# Patient Record
Sex: Female | Born: 1995 | Race: White | Hispanic: No | Marital: Married | Smoking: Never smoker
Health system: Southern US, Community
[De-identification: ages and names within clinical notes are randomized; demographics above are authoritative.]

## PROBLEM LIST (undated history)

## (undated) DIAGNOSIS — Z531 Procedure and treatment not carried out because of patient's decision for reasons of belief and group pressure: Secondary | ICD-10-CM

## (undated) DIAGNOSIS — J45909 Unspecified asthma, uncomplicated: Secondary | ICD-10-CM

## (undated) DIAGNOSIS — F329 Major depressive disorder, single episode, unspecified: Secondary | ICD-10-CM

## (undated) DIAGNOSIS — F32A Depression, unspecified: Secondary | ICD-10-CM

## (undated) DIAGNOSIS — IMO0001 Reserved for inherently not codable concepts without codable children: Secondary | ICD-10-CM

## (undated) HISTORY — PX: FRACTURE SURGERY: SHX138

---

## 2014-08-26 ENCOUNTER — Encounter (HOSPITAL_COMMUNITY): Payer: Self-pay

## 2014-08-26 ENCOUNTER — Emergency Department (HOSPITAL_COMMUNITY)
Admission: EM | Admit: 2014-08-26 | Discharge: 2014-08-26 | Disposition: A | Discharge status: Home / Self Care | Payer: Self-pay | Attending: Emergency Medicine | Admitting: Emergency Medicine

## 2014-08-26 DIAGNOSIS — R531 Weakness: Secondary | ICD-10-CM | POA: Insufficient documentation

## 2014-08-26 DIAGNOSIS — R0602 Shortness of breath: Secondary | ICD-10-CM

## 2014-08-26 DIAGNOSIS — R079 Chest pain, unspecified: Secondary | ICD-10-CM | POA: Insufficient documentation

## 2014-08-26 DIAGNOSIS — R29898 Other symptoms and signs involving the musculoskeletal system: Secondary | ICD-10-CM

## 2014-08-26 DIAGNOSIS — J45901 Unspecified asthma with (acute) exacerbation: Secondary | ICD-10-CM | POA: Insufficient documentation

## 2014-08-26 DIAGNOSIS — R200 Anesthesia of skin: Secondary | ICD-10-CM | POA: Insufficient documentation

## 2014-08-26 HISTORY — DX: Unspecified asthma, uncomplicated: J45.909

## 2014-08-26 LAB — CBG MONITORING, ED: Glucose-Capillary: 91 mg/dL (ref 70–99)

## 2014-08-26 NOTE — ED Notes (Signed)
Checked patient blood sugar it was 91 notified RN of blood sugar 

## 2014-08-26 NOTE — ED Provider Notes (Signed)
CSN: [id number]     Arrival date & time 08/26/14  1333 History   None    No chief complaint on file.    (Consider location/radiation/quality/duration/timing/severity/associated sxs/prior Treatment) Patient is a 19 y.o. female presenting with Acute Neurological Problem. The history is provided by the patient. No language interpreter was used.  Cerebrovascular Accident This is a new problem. The current episode started today. The problem has been resolved. Associated symptoms include weakness. Pertinent negatives include no abdominal pain, arthralgias, chest pain, chills, congestion, coughing, diaphoresis, fatigue, fever, headaches, joint swelling, myalgias, nausea, neck pain, numbness, rash, urinary symptoms, vertigo or vomiting. Nothing aggravates the symptoms. She has tried nothing for the symptoms.    Past Medical History  Diagnosis Date  . Asthma    History reviewed. No pertinent past surgical history. History reviewed. No pertinent family history. History  Substance Use Topics  . Smoking status: Never Smoker   . Smokeless tobacco: Not on file  . Alcohol Use: No   OB History    No data available     Review of Systems  Constitutional: Negative for fever, chills, diaphoresis and fatigue.  HENT: Negative for congestion.   Respiratory: Positive for chest tightness and shortness of breath. Negative for cough.   Cardiovascular: Negative for chest pain.  Gastrointestinal: Negative for nausea, vomiting and abdominal pain.  Musculoskeletal: Negative for myalgias, back pain, joint swelling, arthralgias, gait problem and neck pain.  Skin: Negative for rash.  Neurological: Positive for weakness. Negative for dizziness, vertigo, seizures, syncope, facial asymmetry, speech difficulty, light-headedness, numbness and headaches.  Psychiatric/Behavioral: Negative for confusion.  All other systems reviewed and are negative.     Allergies  Review of patient's allergies indicates no known  allergies.  Home Medications   Prior to Admission medications   Not on File   BP 111/68 mmHg  Pulse 63  Temp(Src) 97.9 F (36.6 C) (Oral)  Resp 15  Ht 4\' 10"  (1.473 m)  Wt 158 lb 9.6 oz (71.94 kg)  BMI 33.16 kg/m2  SpO2 99%  LMP 08/26/2014 (Exact Date) Physical Exam  Constitutional: She is oriented to person, place, and time. Vital signs are normal. She appears well-developed and well-nourished. She is cooperative. She does not appear ill. No distress.  HENT:  Head: Normocephalic and atraumatic.  Nose: Nose normal.  Mouth/Throat: Oropharynx is clear and moist. No oropharyngeal exudate.  Eyes: EOM are normal. Pupils are equal, round, and reactive to light.  Neck: Normal range of motion. Neck supple.  Cardiovascular: Normal rate, regular rhythm, normal heart sounds and intact distal pulses.   No murmur heard. Pulmonary/Chest: Effort normal and breath sounds normal. No respiratory distress. She has no wheezes. She exhibits no tenderness.  Normal work of breathing, clear lungs, nontender  Abdominal: Soft. There is no tenderness. There is no rebound and no guarding.  Musculoskeletal: Normal range of motion. She exhibits no tenderness.  Lymphadenopathy:    She has no cervical adenopathy.  Neurological: She is alert and oriented to person, place, and time. No cranial nerve deficit. Coordination normal.  Full strength and sensation.  Normal coordination.  Steady gait.  No facial droop, clear speech.   Skin: Skin is warm and dry. She is not diaphoretic.  Psychiatric: She has a normal mood and affect. Her behavior is normal. Judgment and thought content normal.  Nursing note and vitals reviewed.   ED Course  Procedures (including critical care time) Labs Review Labs Reviewed  CBG MONITORING, ED    Imaging Review  No results found.   EKG Interpretation   Date/Time:  Friday August 26 2014 14:10:12 EDT Ventricular Rate:  75 PR Interval:  139 QRS Duration: 82 QT Interval:   384 QTC Calculation: 429 R Axis:   56 Text Interpretation:  Sinus arrhythmia Ventricular premature complex  artifact noted Confirmed by Bebe Shaggy  MD, DONALD ([id number]) on 08/26/2014  3:35:49 PM      MDM   Final diagnoses:  Arm heaviness  SOB (shortness of breath)   Pt is a 19 yo F with no PMH who present with bilateral upper extremity numbness and chest pain.  Recently moved to a new house 2 weeks ago, moved all her stuff herself.  Has had intermittent chest pain and SOB on exertion since.  No significant cough, no fevers, myalgias, or abd sx.  No hx of cardiopulmonary disease.  No personal or family hx of blood clots or CAD at young age.   Then today while at work, she developed symmetrical and bilateral gradual onset of arm heaviness.  No other neuro deficits including no confusion, facial droop, difficulty with speech, lower extremity paresthesias/weakness, headache, vision changes, or gait problems.  The arm heaviness resolved spontaneously after < 1 hour. She denies chest pain, lightheadedness, or SOB during the episode.  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   Appears well, vitals normal.  Exam unremarkable.   No cardiac or CVA risk factors.  PERC negative  EKG reassuring and glucose wnl.   Doubt acute emergent pathology as the cause of her sx.  Advised motrin or tylenol PRN for chest pain. Encouraged stretching and exercise.  Given ED return instructions and provided with a work note per request.  Discharged in good condition.   Patient was seen with ED Attending, Dr. Rivka Safer, MD   Lenell Antu, MD 08/27/14 7846  Zadie Rhine, MD 08/28/14 [phone]

## 2014-08-26 NOTE — ED Notes (Addendum)
Pt presents with onset of BUE tingling and numbness while at work today.  Pt reports symptoms are resolving, denies any numbness to face or legs.  Pt reports during episode, she became short of breath.  Grandmother reports pt has been helping move, lifting heavy boxes and is "exhausted".  Pt also reports mid-sternal chest pain that began when she arrived here.

## 2014-08-26 NOTE — ED Notes (Signed)
Pt from work, reports BUE tingling and "heaviness" that started while taking orders at work today. Pt denies any lightheadedness, dizziness or changes in vision. Pt states heaviness is slowly resolving at this time, no neuro deficits noted. Pt also reports [facility]al cp [facility] Denies any pain at this time, denies any previous episodes.

## 2014-08-26 NOTE — ED Provider Notes (Signed)
Patient seen/examined in the Emergency Department [facility] with [facility] Patient reports chest heaviness and Bilateral arm heaviness, resolved at this time Exam : awake/alert, smiling, no distress, no arm drift, no cardiac murmurs, she is well appearing Plan: d/c home    Holly Rhineonald Cinthia Rodden, MD 08/26/14 1544

## 2017-07-06 ENCOUNTER — Other Ambulatory Visit: Payer: Self-pay

## 2017-07-06 ENCOUNTER — Emergency Department (HOSPITAL_COMMUNITY): Payer: Self-pay

## 2017-07-06 ENCOUNTER — Encounter (HOSPITAL_COMMUNITY): Payer: Self-pay

## 2017-07-06 ENCOUNTER — Inpatient Hospital Stay (HOSPITAL_COMMUNITY)
Admission: EM | Admit: 2017-07-06 | Discharge: 2017-07-08 | DRG: 494 | Disposition: A | Discharge status: Home / Self Care | Payer: Self-pay | Attending: Student | Admitting: Student

## 2017-07-06 DIAGNOSIS — Y92410 Unspecified street and highway as the place of occurrence of the external cause: Secondary | ICD-10-CM

## 2017-07-06 DIAGNOSIS — S82832A Other fracture of upper and lower end of left fibula, initial encounter for closed fracture: Secondary | ICD-10-CM | POA: Diagnosis present

## 2017-07-06 DIAGNOSIS — S82202A Unspecified fracture of shaft of left tibia, initial encounter for closed fracture: Secondary | ICD-10-CM

## 2017-07-06 DIAGNOSIS — S82302A Unspecified fracture of lower end of left tibia, initial encounter for closed fracture: Principal | ICD-10-CM | POA: Diagnosis present

## 2017-07-06 DIAGNOSIS — T148XXA Other injury of unspecified body region, initial encounter: Secondary | ICD-10-CM

## 2017-07-06 DIAGNOSIS — S82402A Unspecified fracture of shaft of left fibula, initial encounter for closed fracture: Secondary | ICD-10-CM

## 2017-07-06 DIAGNOSIS — Z419 Encounter for procedure for purposes other than remedying health state, unspecified: Secondary | ICD-10-CM

## 2017-07-06 DIAGNOSIS — Z23 Encounter for immunization: Secondary | ICD-10-CM

## 2017-07-06 DIAGNOSIS — S82462A Displaced segmental fracture of shaft of left fibula, initial encounter for closed fracture: Secondary | ICD-10-CM | POA: Diagnosis present

## 2017-07-06 DIAGNOSIS — J45909 Unspecified asthma, uncomplicated: Secondary | ICD-10-CM | POA: Diagnosis present

## 2017-07-06 HISTORY — DX: Depression, unspecified: F32.A

## 2017-07-06 HISTORY — DX: Procedure and treatment not carried out because of patient's decision for reasons of belief and group pressure: Z53.1

## 2017-07-06 HISTORY — DX: Reserved for inherently not codable concepts without codable children: IMO0001

## 2017-07-06 HISTORY — DX: Major depressive disorder, single episode, unspecified: F32.9

## 2017-07-06 LAB — CBC WITH DIFFERENTIAL/PLATELET
Basophils Absolute: 0 10*3/uL (ref 0.0–0.1)
Basophils Relative: 0 %
Eosinophils Absolute: 0 10*3/uL (ref 0.0–0.7)
Eosinophils Relative: 0 %
HCT: 37.7 % (ref 36.0–46.0)
Hemoglobin: 12.2 g/dL (ref 12.0–15.0)
Lymphocytes Relative: 14 %
Lymphs Abs: 1.8 10*3/uL (ref 0.7–4.0)
MCH: 28.5 pg (ref 26.0–34.0)
MCHC: 32.4 g/dL (ref 30.0–36.0)
MCV: 88.1 fL (ref 78.0–100.0)
Monocytes Absolute: 0.4 10*3/uL (ref 0.1–1.0)
Monocytes Relative: 3 %
Neutro Abs: 10.1 10*3/uL — ABNORMAL HIGH (ref 1.7–7.7)
Neutrophils Relative %: 83 %
Platelets: 182 10*3/uL (ref 150–400)
RBC: 4.28 MIL/uL (ref 3.87–5.11)
RDW: 13.4 % (ref 11.5–15.5)
WBC: 12.3 10*3/uL — ABNORMAL HIGH (ref 4.0–10.5)

## 2017-07-06 LAB — BASIC METABOLIC PANEL
Anion gap: 10 (ref 5–15)
BUN: 10 mg/dL (ref 6–20)
CO2: 21 mmol/L — ABNORMAL LOW (ref 22–32)
Calcium: 9.2 mg/dL (ref 8.9–10.3)
Chloride: 106 mmol/L (ref 101–111)
Creatinine, Ser: 0.73 mg/dL (ref 0.44–1.00)
GFR calc Af Amer: >60 mL/min (ref >60)
GFR calc non Af Amer: >60 mL/min (ref >60)
Glucose, Bld: 109 mg/dL — ABNORMAL HIGH (ref 65–99)
Potassium: 4 mmol/L (ref 3.5–5.1)
Sodium: 137 mmol/L (ref 135–145)

## 2017-07-06 MED ORDER — OXYCODONE HCL 5 MG PO TABS
5.0000 mg | ORAL_TABLET | ORAL | Status: DC | PRN
  Administered 2017-07-07 – 2017-07-08 (×3): 10 mg via ORAL
  Filled 2017-07-06 (×4): qty 2

## 2017-07-06 MED ORDER — METHOCARBAMOL 1000 MG/10ML IJ SOLN: 500.0000 mg | Freq: Four times a day (QID) | INTRAVENOUS | Status: DC | PRN

## 2017-07-06 MED ORDER — SENNA 8.6 MG PO TABS
1.0000 | ORAL_TABLET | Freq: Two times a day (BID) | ORAL | Status: DC
  Administered 2017-07-07 – 2017-07-08 (×3): 8.6 mg via ORAL
  Filled 2017-07-06 (×3): qty 1

## 2017-07-06 MED ORDER — MORPHINE SULFATE (PF) 4 MG/ML IV SOLN
4.0000 mg | Freq: Once | INTRAVENOUS | Status: AC
  Administered 2017-07-06: 4 mg via INTRAVENOUS
  Filled 2017-07-06: qty 1

## 2017-07-06 MED ORDER — ACETAMINOPHEN 650 MG RE SUPP: 650.0000 mg | Freq: Four times a day (QID) | RECTAL | Status: DC | PRN

## 2017-07-06 MED ORDER — MORPHINE SULFATE (PF) 2 MG/ML IV SOLN
2.0000 mg | INTRAVENOUS | Status: DC | PRN
  Administered 2017-07-07 (×2): 2 mg via INTRAVENOUS
  Filled 2017-07-06 (×2): qty 1

## 2017-07-06 MED ORDER — ACETAMINOPHEN 325 MG PO TABS: 650.0000 mg | ORAL_TABLET | Freq: Four times a day (QID) | ORAL | Status: DC | PRN

## 2017-07-06 MED ORDER — METHOCARBAMOL 500 MG PO TABS
500.0000 mg | ORAL_TABLET | Freq: Four times a day (QID) | ORAL | Status: DC | PRN
  Administered 2017-07-07 – 2017-07-08 (×2): 500 mg via ORAL
  Filled 2017-07-06 (×2): qty 1

## 2017-07-06 MED ORDER — ONDANSETRON HCL 4 MG/2ML IJ SOLN: 4.0000 mg | Freq: Four times a day (QID) | INTRAMUSCULAR | Status: DC | PRN

## 2017-07-06 MED ORDER — ONDANSETRON HCL 4 MG PO TABS: 4.0000 mg | ORAL_TABLET | Freq: Four times a day (QID) | ORAL | Status: DC | PRN

## 2017-07-06 NOTE — Progress Notes (Signed)
Orthopedic Tech Progress Note Patient Details:  Holly JacobMolly [name] 1995-09-21 [id number]  Ortho Devices Type of Ortho Device: Post (short leg) splint Ortho Device/Splint Location: lle Ortho Device/Splint Interventions: Ordered, Application, Adjustment   Post Interventions Patient Tolerated: Well Instructions Provided: Care of device, Adjustment of device   [name], [name] 07/06/2017, 10:15 PM

## 2017-07-06 NOTE — ED Notes (Signed)
Nurse currently drawing labs from IV.
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
## 2017-07-07 ENCOUNTER — Encounter (HOSPITAL_COMMUNITY)
Admission: EM | Disposition: A | Discharge status: Home / Self Care | Payer: Self-pay | Source: Home / Self Care | Attending: Orthopedic Surgery

## 2017-07-07 ENCOUNTER — Inpatient Hospital Stay (HOSPITAL_COMMUNITY): Payer: Self-pay | Admitting: Certified Registered"

## 2017-07-07 ENCOUNTER — Inpatient Hospital Stay (HOSPITAL_COMMUNITY): Payer: Self-pay

## 2017-07-07 ENCOUNTER — Encounter (HOSPITAL_COMMUNITY): Payer: Self-pay | Admitting: Orthopedic Surgery

## 2017-07-07 HISTORY — PX: ORIF TIBIA FRACTURE: SHX5416

## 2017-07-07 HISTORY — PX: ORIF TIBIA & FIBULA FRACTURES: SHX2131

## 2017-07-07 LAB — MRSA PCR SCREENING: MRSA BY PCR: NEGATIVE

## 2017-07-07 LAB — HIV ANTIBODY (ROUTINE TESTING W REFLEX): HIV Screen 4th Generation wRfx: NONREACTIVE

## 2017-07-07 LAB — PREGNANCY, URINE: Preg Test, Ur: NEGATIVE

## 2017-07-07 SURGERY — OPEN REDUCTION INTERNAL FIXATION (ORIF) TIBIA FRACTURE
Anesthesia: General | Site: Ankle | Laterality: Left

## 2017-07-07 MED ORDER — CEFAZOLIN SODIUM-DEXTROSE 2-4 GM/100ML-% IV SOLN
2.0000 g | INTRAVENOUS | Status: AC
  Administered 2017-07-07: 2 g via INTRAVENOUS
  Filled 2017-07-07 (×2): qty 100

## 2017-07-07 MED ORDER — PROPOFOL 10 MG/ML IV BOLUS
INTRAVENOUS | Status: AC
  Filled 2017-07-07: qty 20

## 2017-07-07 MED ORDER — LIDOCAINE HCL (CARDIAC) 20 MG/ML IV SOLN
INTRAVENOUS | Status: DC | PRN
  Administered 2017-07-07: 70 mg via INTRAVENOUS

## 2017-07-07 MED ORDER — OXYCODONE HCL 5 MG PO TABS: 5.0000 mg | ORAL_TABLET | Freq: Once | ORAL | Status: DC | PRN

## 2017-07-07 MED ORDER — PROPOFOL 10 MG/ML IV BOLUS
INTRAVENOUS | Status: DC | PRN
  Administered 2017-07-07: 160 mg via INTRAVENOUS

## 2017-07-07 MED ORDER — ONDANSETRON HCL 4 MG/2ML IJ SOLN
INTRAMUSCULAR | Status: DC | PRN
  Administered 2017-07-07: 4 mg via INTRAVENOUS

## 2017-07-07 MED ORDER — MIDAZOLAM HCL 2 MG/2ML IJ SOLN
INTRAMUSCULAR | Status: AC
  Administered 2017-07-07: 2 mg via INTRAVENOUS
  Filled 2017-07-07: qty 2

## 2017-07-07 MED ORDER — FENTANYL CITRATE (PF) 100 MCG/2ML IJ SOLN: 25.0000 ug | INTRAMUSCULAR | Status: DC | PRN

## 2017-07-07 MED ORDER — PNEUMOCOCCAL VAC POLYVALENT 25 MCG/0.5ML IJ INJ
0.5000 mL | INJECTION | INTRAMUSCULAR | Status: AC
  Administered 2017-07-08: 0.5 mL via INTRAMUSCULAR
  Filled 2017-07-07: qty 0.5

## 2017-07-07 MED ORDER — FENTANYL CITRATE (PF) 250 MCG/5ML IJ SOLN
INTRAMUSCULAR | Status: AC
  Filled 2017-07-07: qty 5

## 2017-07-07 MED ORDER — FENTANYL CITRATE (PF) 100 MCG/2ML IJ SOLN: 50.0000 ug | Freq: Once | INTRAMUSCULAR | Status: DC

## 2017-07-07 MED ORDER — 0.9 % SODIUM CHLORIDE (POUR BTL) OPTIME
TOPICAL | Status: DC | PRN
  Administered 2017-07-07: 1000 mL

## 2017-07-07 MED ORDER — MIDAZOLAM HCL 2 MG/2ML IJ SOLN
INTRAMUSCULAR | Status: AC
  Filled 2017-07-07: qty 2

## 2017-07-07 MED ORDER — ONDANSETRON HCL 4 MG/2ML IJ SOLN: 4.0000 mg | Freq: Four times a day (QID) | INTRAMUSCULAR | Status: DC | PRN

## 2017-07-07 MED ORDER — FENTANYL CITRATE (PF) 100 MCG/2ML IJ SOLN
100.0000 ug | Freq: Once | INTRAMUSCULAR | Status: AC
  Administered 2017-07-07: 100 ug via INTRAVENOUS

## 2017-07-07 MED ORDER — OXYCODONE HCL 5 MG/5ML PO SOLN: 5.0000 mg | Freq: Once | ORAL | Status: DC | PRN

## 2017-07-07 MED ORDER — MIDAZOLAM HCL 2 MG/2ML IJ SOLN
2.0000 mg | Freq: Once | INTRAMUSCULAR | Status: AC
  Administered 2017-07-07: 2 mg via INTRAVENOUS

## 2017-07-07 MED ORDER — FENTANYL CITRATE (PF) 100 MCG/2ML IJ SOLN
INTRAMUSCULAR | Status: AC
  Administered 2017-07-07: 100 ug via INTRAVENOUS
  Filled 2017-07-07: qty 2

## 2017-07-07 MED ORDER — FENTANYL CITRATE (PF) 100 MCG/2ML IJ SOLN
INTRAMUSCULAR | Status: DC | PRN
  Administered 2017-07-07 (×3): 25 ug via INTRAVENOUS

## 2017-07-07 MED ORDER — DEXAMETHASONE SODIUM PHOSPHATE 10 MG/ML IJ SOLN
INTRAMUSCULAR | Status: DC | PRN
  Administered 2017-07-07: 10 mg via INTRAVENOUS

## 2017-07-07 MED ORDER — LACTATED RINGERS IV SOLN
INTRAVENOUS | Status: DC
  Administered 2017-07-07: 11:00:00 via INTRAVENOUS

## 2017-07-07 MED ORDER — BACITRACIN ZINC 500 UNIT/GM EX OINT
TOPICAL_OINTMENT | CUTANEOUS | Status: AC
  Filled 2017-07-07: qty 28.35

## 2017-07-07 MED ORDER — CEFAZOLIN SODIUM-DEXTROSE 2-4 GM/100ML-% IV SOLN
2.0000 g | Freq: Three times a day (TID) | INTRAVENOUS | Status: AC
  Administered 2017-07-07 – 2017-07-08 (×3): 2 g via INTRAVENOUS
  Filled 2017-07-07 (×3): qty 100

## 2017-07-07 MED ORDER — VANCOMYCIN HCL 1000 MG IV SOLR
INTRAVENOUS | Status: AC
  Filled 2017-07-07: qty 1000

## 2017-07-07 MED ORDER — VANCOMYCIN HCL 1000 MG IV SOLR
INTRAVENOUS | Status: DC | PRN
  Administered 2017-07-07: 1000 mg

## 2017-07-07 MED ORDER — LACTATED RINGERS IV SOLN
INTRAVENOUS | Status: DC | PRN
  Administered 2017-07-07 (×2): via INTRAVENOUS

## 2017-07-07 MED ORDER — INFLUENZA VAC SPLIT QUAD 0.5 ML IM SUSY
0.5000 mL | PREFILLED_SYRINGE | INTRAMUSCULAR | Status: AC
  Administered 2017-07-08: 0.5 mL via INTRAMUSCULAR
  Filled 2017-07-07: qty 0.5

## 2017-07-07 MED ORDER — BUPIVACAINE-EPINEPHRINE (PF) 0.5% -1:200000 IJ SOLN
INTRAMUSCULAR | Status: DC | PRN
  Administered 2017-07-07: 15 mL via PERINEURAL
  Administered 2017-07-07: 25 mL via PERINEURAL

## 2017-07-07 SURGICAL SUPPLY — 77 items
BANDAGE ACE 4X5 VEL STRL LF (GAUZE/BANDAGES/DRESSINGS) ×2 IMPLANT
BANDAGE ACE 6X5 VEL STRL LF (GAUZE/BANDAGES/DRESSINGS) ×2 IMPLANT
BANDAGE ESMARK 6X9 LF (GAUZE/BANDAGES/DRESSINGS) ×1 IMPLANT
BIT DRILL 2.5X110 QC LCP DISP (BIT) ×2 IMPLANT
BIT DRILL LCP QC 2X140 (BIT) ×2 IMPLANT
BIT DRILL LONG 2.7 (BIT) ×1 IMPLANT
BIT DRILL QC 3.5X110 (BIT) ×2 IMPLANT
BNDG COHESIVE 4X5 TAN STRL (GAUZE/BANDAGES/DRESSINGS) IMPLANT
BNDG ESMARK 6X9 LF (GAUZE/BANDAGES/DRESSINGS) ×2
BNDG GAUZE ELAST 4 BULKY (GAUZE/BANDAGES/DRESSINGS) ×4 IMPLANT
BRUSH SCRUB SURG 4.25 DISP (MISCELLANEOUS) ×2 IMPLANT
CHLORAPREP W/TINT 26ML (MISCELLANEOUS) ×2 IMPLANT
COVER MAYO STAND STRL (DRAPES) IMPLANT
COVER SURGICAL LIGHT HANDLE (MISCELLANEOUS) ×2 IMPLANT
DRAPE C-ARM 42X72 X-RAY (DRAPES) ×2 IMPLANT
DRAPE C-ARMOR (DRAPES) ×2 IMPLANT
DRAPE HALF SHEET 40X57 (DRAPES) ×2 IMPLANT
DRAPE INCISE IOBAN 66X45 STRL (DRAPES) IMPLANT
DRAPE U-SHAPE 47X51 STRL (DRAPES) ×2 IMPLANT
DRILL BIT LONG 2.7 (BIT) ×2
DRSG ADAPTIC 3X8 NADH LF (GAUZE/BANDAGES/DRESSINGS) ×4 IMPLANT
DRSG PAD ABDOMINAL 8X10 ST (GAUZE/BANDAGES/DRESSINGS) ×8 IMPLANT
ELECT REM PT RETURN 9FT ADLT (ELECTROSURGICAL) ×2
ELECTRODE REM PT RTRN 9FT ADLT (ELECTROSURGICAL) ×1 IMPLANT
GAUZE SPONGE 4X4 12PLY STRL (GAUZE/BANDAGES/DRESSINGS) ×4 IMPLANT
GAUZE XEROFORM 5X9 LF (GAUZE/BANDAGES/DRESSINGS) ×2 IMPLANT
GLOVE BIO SURGEON STRL SZ7.5 (GLOVE) ×8 IMPLANT
GLOVE BIOGEL PI IND STRL 7.0 (GLOVE) ×1 IMPLANT
GLOVE BIOGEL PI IND STRL 7.5 (GLOVE) ×1 IMPLANT
GLOVE BIOGEL PI INDICATOR 7.0 (GLOVE) ×1
GLOVE BIOGEL PI INDICATOR 7.5 (GLOVE) ×1
GLOVE PROGUARD SZ 7 1/2 (GLOVE) ×2 IMPLANT
GLOVE SURG SS PI 7.0 STRL IVOR (GLOVE) ×2 IMPLANT
GOWN STRL REUS W/ TWL LRG LVL3 (GOWN DISPOSABLE) ×2 IMPLANT
GOWN STRL REUS W/TWL LRG LVL3 (GOWN DISPOSABLE) ×2
KIT BASIN OR (CUSTOM PROCEDURE TRAY) ×2 IMPLANT
KIT ROOM TURNOVER OR (KITS) ×2 IMPLANT
MANIFOLD NEPTUNE II (INSTRUMENTS) ×2 IMPLANT
NS IRRIG 1000ML POUR BTL (IV SOLUTION) ×2 IMPLANT
PACK TOTAL JOINT (CUSTOM PROCEDURE TRAY) ×2 IMPLANT
PAD ABD 8X10 STRL (GAUZE/BANDAGES/DRESSINGS) ×2 IMPLANT
PAD ARMBOARD 7.5X6 YLW CONV (MISCELLANEOUS) ×2 IMPLANT
PAD CAST 4YDX4 CTTN HI CHSV (CAST SUPPLIES) ×1 IMPLANT
PADDING CAST COTTON 4X4 STRL (CAST SUPPLIES) ×1
PADDING CAST COTTON 6X4 STRL (CAST SUPPLIES) ×2 IMPLANT
PLATE 4H VA LCP MED DISTAL TIB (Screw) ×2 IMPLANT
PLATE LCP RECON 3.5 8H/112 (Plate) ×2 IMPLANT
SCREW CORTEX 3.5 12MM (Screw) ×2 IMPLANT
SCREW CORTEX 3.5 14MM (Screw) ×2 IMPLANT
SCREW CORTEX 3.5 16MM (Screw) ×2 IMPLANT
SCREW CORTEX LOW PRO 3.5X26 (Screw) ×4 IMPLANT
SCREW CORTEX LOW PRO 3.5X28 (Screw) ×2 IMPLANT
SCREW CORTEX LOW PRO 3.5X32 (Screw) ×2 IMPLANT
SCREW LOCK CORT ST 3.5X12 (Screw) ×2 IMPLANT
SCREW LOCK CORT ST 3.5X14 (Screw) ×2 IMPLANT
SCREW LOCK CORT ST 3.5X16 (Screw) ×2 IMPLANT
SCREW LOCKING 2.7X16MM VA (Screw) ×2 IMPLANT
SCREW LOCKING VA 2.7X38 (Screw) ×2 IMPLANT
SCREW LOCKING VA 2.7X40MM (Screw) ×2 IMPLANT
SCREW LOW PROFILE 3.5X36MM (Screw) ×2 IMPLANT
SCREW SELF TAP 12M (Screw) ×2 IMPLANT
SLEEVE SURGEON STRL (DRAPES) ×2 IMPLANT
SPONGE LAP 18X18 X RAY DECT (DISPOSABLE) IMPLANT
STAPLER VISISTAT 35W (STAPLE) ×2 IMPLANT
STOCKINETTE IMPERVIOUS LG (DRAPES) IMPLANT
SUCTION FRAZIER HANDLE 10FR (MISCELLANEOUS) ×1
SUCTION TUBE FRAZIER 10FR DISP (MISCELLANEOUS) ×1 IMPLANT
SUT ETHILON 3 0 PS 1 (SUTURE) ×6 IMPLANT
SUT VIC AB 0 CT1 27 (SUTURE) ×2
SUT VIC AB 0 CT1 27XBRD ANBCTR (SUTURE) ×2 IMPLANT
SUT VIC AB 0 CT2 27 (SUTURE) ×2 IMPLANT
SUT VIC AB 2-0 CT1 27 (SUTURE) ×2
SUT VIC AB 2-0 CT1 TAPERPNT 27 (SUTURE) ×2 IMPLANT
TOWEL OR 17X24 6PK STRL BLUE (TOWEL DISPOSABLE) IMPLANT
TOWEL OR 17X26 10 PK STRL BLUE (TOWEL DISPOSABLE) ×4 IMPLANT
TUBE CONNECTING 12X1/4 (SUCTIONS) IMPLANT
YANKAUER SUCT BULB TIP NO VENT (SUCTIONS) IMPLANT

## 2017-07-07 NOTE — Anesthesia Postprocedure Evaluation (Signed)
Anesthesia Post Note  Patient: Hedy JacobMolly Rottenberg  Procedure(s) Performed: OPEN REDUCTION INTERNAL FIXATION (ORIF) TIBIA/FIBULAFRACTURE (Left Ankle)     Patient location during evaluation: PACU Anesthesia Type: General Level of consciousness: awake and alert Pain management: pain level controlled Vital Signs Assessment: post-procedure vital signs reviewed and stable Respiratory status: spontaneous breathing, nonlabored ventilation, respiratory function stable and patient connected to nasal cannula oxygen Cardiovascular status: blood pressure returned to baseline and stable Postop Assessment: no apparent nausea or vomiting Anesthetic complications: no    Last Vitals:  Vitals:   07/07/17 1600 07/07/17 1615  BP: 111/75 112/73  Pulse: (!) 104 96  Resp: (!) 31 19  Temp:  (!) 36.4 C  SpO2: 96% 96%    Last Pain:  Vitals:   07/07/17 1615  TempSrc:   PainSc: 0-No pain                 Xylah Early COKER

## 2017-07-07 NOTE — Anesthesia Preprocedure Evaluation (Addendum)
Anesthesia Evaluation  Patient identified by MRN, date of birth, ID band Patient awake    Reviewed: Allergy & Precautions, H&P , NPO status , Patient's Chart, lab work & pertinent test results  Airway Mallampati: II   Neck ROM: full    Dental  (+) Teeth Intact, Dental Advisory Given   Pulmonary asthma ,    breath sounds clear to auscultation       Cardiovascular negative cardio ROS   Rhythm:regular Rate:Normal     Neuro/Psych    GI/Hepatic   Endo/Other  obese  Renal/GU      Musculoskeletal Left ankle fx   Abdominal   Peds  Hematology  (+) REFUSES BLOOD PRODUCTS, JEHOVAH'S WITNESS  Anesthesia Other Findings   Reproductive/Obstetrics                          Anesthesia Physical Anesthesia Plan  ASA: II  Anesthesia Plan: General   Post-op Pain Management:  Regional for Post-op pain   Induction: Intravenous  PONV Risk Score and Plan: 3 and Ondansetron, Dexamethasone, Midazolam and Treatment may vary due to age or medical condition  Airway Management Planned: LMA  Additional Equipment:   Intra-op Plan:   Post-operative Plan:   Informed Consent: I have reviewed the patients History and Physical, chart, labs and discussed the procedure including the risks, benefits and alternatives for the proposed anesthesia with the patient or authorized representative who has indicated his/her understanding and acceptance.     Plan Discussed with: CRNA, Anesthesiologist and Surgeon  Anesthesia Plan Comments:         Anesthesia Quick Evaluation
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
## 2017-07-07 NOTE — Anesthesia Procedure Notes (Signed)
Anesthesia Regional Block: Adductor canal block   Pre-Anesthetic Checklist: ,, timeout performed, Correct Patient, Correct Site, Correct Laterality, Correct Procedure, Correct Position, site marked, Risks and benefits discussed,  Surgical consent,  Pre-op evaluation,  At surgeon's request and post-op pain management  Laterality: Left  Prep: chloraprep       Needles:  Injection technique: Single-shot  Needle Type: Echogenic Needle     Needle Length: 9cm  Needle Gauge: 21     Additional Needles:   Narrative:  Start time: 07/07/2017 11:45 AM End time: 07/07/2017 11:51 AM Injection made incrementally with aspirations every 5 mL.  Performed by: Personally  Anesthesiologist: Achille RichHodierne, Heather Mckendree, MD  Additional Notes: Pt tolerated the procedure well.

## 2017-07-07 NOTE — Anesthesia Procedure Notes (Signed)
Procedure Name: LMA Insertion Date/Time: 07/07/2017 12:45 PM Performed by: Lanell MatarBaker, Mykeal Carrick M, CRNA Pre-anesthesia Checklist: Patient identified, Emergency Drugs available, Suction available and Patient being monitored Patient Re-evaluated:Patient Re-evaluated prior to induction Oxygen Delivery Method: Circle System Utilized Preoxygenation: Pre-oxygenation with 100% oxygen Induction Type: IV induction LMA: LMA inserted LMA Size: 4.0 Number of attempts: 1 Placement Confirmation: positive ETCO2 Tube secured with: Tape Dental Injury: Teeth and Oropharynx as per pre-operative assessment
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
## 2017-07-07 NOTE — Transfer of Care (Signed)
Immediate Anesthesia Transfer of Care Note  Patient: Holly [name]  Procedure(s) Performed: OPEN REDUCTION INTERNAL FIXATION (ORIF) TIBIA/FIBULAFRACTURE (Left Ankle)  Patient Location: PACU  Anesthesia Type:General  Level of Consciousness: awake, alert  and oriented  Airway & Oxygen Therapy: Patient Spontanous Breathing  Post-op Assessment: Report given to RN, Post -op Vital signs reviewed and stable and Patient moving all extremities  Post vital signs: Reviewed and stable  Last Vitals:  Vitals:   07/07/17 1145 07/07/17 1150  BP: 117/62 (!) 112/56  Pulse: 97 (!) 104  Resp: 12 14  Temp:    SpO2: 100% 100%    Last Pain:  Vitals:   07/07/17 1100  TempSrc: Oral  PainSc:       Patients Stated Pain Goal: 3 (07/07/17 1010)  Complications: No apparent anesthesia complications regional block working well.

## 2017-07-07 NOTE — Anesthesia Procedure Notes (Signed)
Anesthesia Regional Block: Popliteal block   Pre-Anesthetic Checklist: ,, timeout performed, Correct Patient, Correct Site, Correct Laterality, Correct Procedure, Correct Position, site marked, Risks and benefits discussed,  Surgical consent,  Pre-op evaluation,  At surgeon's request and post-op pain management  Laterality: Left  Prep: chloraprep       Needles:  Injection technique: Single-shot  Needle Type: Echogenic Stimulator Needle          Additional Needles:   Procedures:, nerve stimulator,,,,,,,   Nerve Stimulator or Paresthesia:  Response: plantar flexion of foot, 0.45 mA,   Additional Responses:   Narrative:  Start time: 07/07/2017 11:40 AM End time: 07/07/2017 11:46 AM Injection made incrementally with aspirations every 5 mL.  Performed by: Personally  Anesthesiologist: Achille RichHodierne, Deidra Spease, MD  Additional Notes: Functioning IV was confirmed and monitors were applied.  A 90mm 21ga Arrow echogenic stimulator needle was used. Sterile prep and drape,hand hygiene and sterile gloves were used.  Negative aspiration and negative test dose prior to incremental administration of local anesthetic. The patient tolerated the procedure well.  Ultrasound guidance: relevent anatomy identified, needle position confirmed, local anesthetic spread visualized around nerve(s), vascular puncture avoided.  Image printed for medical record.
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
## 2017-07-08 MED ORDER — ASPIRIN EC 81 MG PO TBEC: 81.0000 mg | DELAYED_RELEASE_TABLET | Freq: Every day | ORAL | 0 refills | Status: AC

## 2017-07-08 MED ORDER — METHOCARBAMOL 750 MG PO TABS: 750.0000 mg | ORAL_TABLET | Freq: Four times a day (QID) | ORAL | 0 refills | Status: DC | PRN

## 2017-07-08 MED ORDER — OXYCODONE-ACETAMINOPHEN 5-325 MG PO TABS: 1.0000 | ORAL_TABLET | ORAL | 0 refills | Status: DC | PRN
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
## 2017-07-08 NOTE — Discharge Instructions (Signed)
Orthopaedic Trauma Service Discharge Instructions   General Discharge Instructions  WEIGHT BEARING STATUS: Nonweightbearing left leg  RANGE OF MOTION/ACTIVITY: Stay in splint, do not remove. Move your knee as much as possible  Wound Care: Keep splint in place, do not get wet.  DVT/PE prophylaxis: Baby aspirin (81mg ) once a day  Diet: as you were eating previously.  Can use over the counter stool softeners and bowel preparations, such as Miralax, to help with bowel movements.  Narcotics can be constipating.  Be sure to drink plenty of fluids  PAIN MEDICATION USE AND EXPECTATIONS  You have likely been given narcotic medications to help control your pain.  After a traumatic event that results in an fracture (broken bone) with or without surgery, it is ok to use narcotic pain medications to help control one's pain.  We understand that everyone responds to pain differently and each individual patient will be evaluated on a regular basis for the continued need for narcotic medications. Ideally, narcotic medication use should last no more than 6-8 weeks (coinciding with fracture healing).   As a patient it is your responsibility as well to monitor narcotic medication use and report the amount and frequency you use these medications when you come to your office visit.   We would also advise that if you are using narcotic medications, you should take a dose prior to therapy to maximize you participation.  IF YOU ARE ON NARCOTIC MEDICATIONS IT IS NOT PERMISSIBLE TO OPERATE A MOTOR VEHICLE (MOTORCYCLE/CAR/TRUCK/MOPED) OR HEAVY MACHINERY DO NOT MIX NARCOTICS WITH OTHER CNS (CENTRAL NERVOUS SYSTEM) DEPRESSANTS SUCH AS ALCOHOL   STOP SMOKING OR USING NICOTINE PRODUCTS!!!!  As discussed nicotine severely impairs your body's ability to heal surgical and traumatic wounds but also impairs bone healing.  Wounds and bone heal by forming microscopic blood vessels (angiogenesis) and nicotine is a vasoconstrictor  (essentially, shrinks blood vessels).  Therefore, if vasoconstriction occurs to these microscopic blood vessels they essentially disappear and are unable to deliver necessary nutrients to the healing tissue.  This is one modifiable factor that you can do to dramatically increase your chances of healing your injury.    (This means no smoking, no nicotine gum, patches, etc)  DO NOT USE NONSTEROIDAL ANTI-INFLAMMATORY DRUGS (NSAID'S)  Using products such as Advil (ibuprofen), Aleve (naproxen), Motrin (ibuprofen) for additional pain control during fracture healing can delay and/or prevent the healing response.  If you would like to take over the counter (OTC) medication, Tylenol (acetaminophen) is ok.  However, some narcotic medications that are given for pain control contain acetaminophen as well. Therefore, you should not exceed more than 4000 mg of tylenol in a day if you do not have liver disease.  Also note that there are may OTC medicines, such as cold medicines and allergy medicines that my contain tylenol as well.  If you have any questions about medications and/or interactions please ask your doctor/PA or your pharmacist.      ICE AND ELEVATE INJURED/OPERATIVE EXTREMITY  Using ice and elevating the injured extremity above your heart can help with swelling and pain control.  Icing in a pulsatile fashion, such as 20 minutes on and 20 minutes off, can be followed.    Do not place ice directly on skin. Make sure there is a barrier between to skin and the ice pack.    Using frozen items such as frozen peas works well as the conform nicely to the are that needs to be iced.  USE AN ACE  WRAP OR TED HOSE FOR SWELLING CONTROL  In addition to icing and elevation, Ace wraps or TED hose are used to help limit and resolve swelling.  It is recommended to use Ace wraps or TED hose until you are informed to stop.    When using Ace Wraps start the wrapping distally (farthest away from the body) and wrap proximally  (closer to the body)   Example: If you had surgery on your leg or thing and you do not have a splint on, start the ace wrap at the toes and work your way up to the thigh        If you had surgery on your upper extremity and do not have a splint on, start the ace wrap at your fingers and work your way up to the upper arm  IF YOU ARE IN A SPLINT OR CAST DO NOT REMOVE IT FOR ANY REASON   If your splint gets wet for any reason please contact the office immediately. You may shower in your splint or cast as long as you keep it dry.  This can be done by wrapping in a cast cover or garbage back (or similar)  Do Not stick any thing down your splint or cast such as pencils, money, or hangers to try and scratch yourself with.  If you feel itchy take benadryl as prescribed on the bottle for itching  CALL THE OFFICE WITH ANY QUESTIONS OR CONCERNS: [phone]

## 2017-07-08 NOTE — Progress Notes (Signed)
Patient is discharged to her home with family member.  Written and verbal instructions provided.  Patient and her mother verbalizes understanding the instructions and follow up.
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
## 2017-07-09 ENCOUNTER — Encounter (HOSPITAL_COMMUNITY): Payer: Self-pay | Admitting: Student

## 2018-01-30 ENCOUNTER — Ambulatory Visit: Payer: Self-pay | Attending: Nurse Practitioner | Admitting: Nurse Practitioner

## 2018-01-30 ENCOUNTER — Encounter: Payer: Self-pay | Admitting: Nurse Practitioner

## 2018-01-30 VITALS — BP 110/75 | HR 79 | Temp 98.1°F | Ht <= 58 in | Wt 188.0 lb

## 2018-01-30 DIAGNOSIS — Z8249 Family history of ischemic heart disease and other diseases of the circulatory system: Secondary | ICD-10-CM | POA: Insufficient documentation

## 2018-01-30 DIAGNOSIS — F321 Major depressive disorder, single episode, moderate: Secondary | ICD-10-CM | POA: Insufficient documentation

## 2018-01-30 DIAGNOSIS — J45909 Unspecified asthma, uncomplicated: Secondary | ICD-10-CM | POA: Insufficient documentation

## 2018-01-30 MED ORDER — ESCITALOPRAM OXALATE 10 MG PO TABS: 10.0000 mg | ORAL_TABLET | Freq: Every day | ORAL | 1 refills | Status: DC

## 2018-01-30 MED FILL — ESCITALOPRAM 10 MG TABLET: 10 | 30 days supply | Qty: 30 | Fill #0

## 2018-01-30 NOTE — Patient Instructions (Signed)
Escitalopram tablets What is this medicine? ESCITALOPRAM (es sye TAL oh pram) is used to treat depression and certain types of anxiety. This medicine may be used for other purposes; ask your health care provider or pharmacist if you have questions. COMMON BRAND NAME(S): Lexapro What should I tell my health care provider before I take this medicine? They need to know if you have any of these conditions: -bipolar disorder or a family history of bipolar disorder -diabetes -glaucoma -heart disease -kidney or liver disease -receiving electroconvulsive therapy -seizures (convulsions) -suicidal thoughts, plans, or attempt by you or a family member -an unusual or allergic reaction to escitalopram, the related drug citalopram, other medicines, foods, dyes, or preservatives -pregnant or trying to become pregnant -breast-feeding How should I use this medicine? Take this medicine by mouth with a glass of water. Follow the directions on the prescription label. You can take it with or without food. If it upsets your stomach, take it with food. Take your medicine at regular intervals. Do not take it more often than directed. Do not stop taking this medicine suddenly except upon the advice of your doctor. Stopping this medicine too quickly may cause serious side effects or your condition may worsen. A special MedGuide will be given to you by the pharmacist with each prescription and refill. Be sure to read this information carefully each time. Talk to your pediatrician regarding the use of this medicine in children. Special care may be needed. Overdosage: If you think you have taken too much of this medicine contact a poison control center or emergency room at once. NOTE: This medicine is only for you. Do not share this medicine with others. What if I miss a dose? If you miss a dose, take it as soon as you can. If it is almost time for your next dose, take only that dose. Do not take double or extra  doses. What may interact with this medicine? Do not take this medicine with any of the following medications: -certain medicines for fungal infections like fluconazole, itraconazole, ketoconazole, posaconazole, voriconazole -cisapride -citalopram -dofetilide -dronedarone -linezolid -MAOIs like Carbex, Eldepryl, Marplan, Nardil, and Parnate -methylene blue (injected into a vein) -pimozide -thioridazine -ziprasidone This medicine may also interact with the following medications: -alcohol -amphetamines -aspirin and aspirin-like medicines -carbamazepine -certain medicines for depression, anxiety, or psychotic disturbances -certain medicines for migraine headache like almotriptan, eletriptan, frovatriptan, naratriptan, rizatriptan, sumatriptan, zolmitriptan -certain medicines for sleep -certain medicines that treat or prevent blood clots like warfarin, enoxaparin, dalteparin -cimetidine -diuretics -fentanyl -furazolidone -isoniazid -lithium -metoprolol -NSAIDs, medicines for pain and inflammation, like ibuprofen or naproxen -other medicines that prolong the QT interval (cause an abnormal heart rhythm) -procarbazine -rasagiline -supplements like St. John's wort, kava kava, valerian -tramadol -tryptophan This list may not describe all possible interactions. Give your health care provider a list of all the medicines, herbs, non-prescription drugs, or dietary supplements you use. Also tell them if you smoke, drink alcohol, or use illegal drugs. Some items may interact with your medicine. What should I watch for while using this medicine? Tell your doctor if your symptoms do not get better or if they get worse. Visit your doctor or health care professional for regular checks on your progress. Because it may take several weeks to see the full effects of this medicine, it is important to continue your treatment as prescribed by your doctor. Patients and their families should watch out for  new or worsening thoughts of suicide or depression. Also watch out for   sudden changes in feelings such as feeling anxious, agitated, panicky, irritable, hostile, aggressive, impulsive, severely restless, overly excited and hyperactive, or not being able to sleep. If this happens, especially at the beginning of treatment or after a change in dose, call your health care professional. You may get drowsy or dizzy. Do not drive, use machinery, or do anything that needs mental alertness until you know how this medicine affects you. Do not stand or sit up quickly, especially if you are an older patient. This reduces the risk of dizzy or fainting spells. Alcohol may interfere with the effect of this medicine. Avoid alcoholic drinks. Your mouth may get dry. Chewing sugarless gum or sucking hard candy, and drinking plenty of water may help. Contact your doctor if the problem does not go away or is severe. What side effects may I notice from receiving this medicine? Side effects that you should report to your doctor or health care professional as soon as possible: -allergic reactions like skin rash, itching or hives, swelling of the face, lips, or tongue -anxious -black, tarry stools -changes in vision -confusion -elevated mood, decreased need for sleep, racing thoughts, impulsive behavior -eye pain -fast, irregular heartbeat -feeling faint or lightheaded, falls -feeling agitated, angry, or irritable -hallucination, loss of contact with reality -loss of balance or coordination -loss of memory -painful or prolonged erections -restlessness, pacing, inability to keep still -seizures -stiff muscles -suicidal thoughts or other mood changes -trouble sleeping -unusual bleeding or bruising -unusually weak or tired -vomiting Side effects that usually do not require medical attention (report to your doctor or health care professional if they continue or are bothersome): -changes in appetite -change in sex  drive or performance -headache -increased sweating -indigestion, nausea -tremors This list may not describe all possible side effects. Call your doctor for medical advice about side effects. You may report side effects to FDA at 1-800-FDA-1088. Where should I keep my medicine? Keep out of reach of children. Store at room temperature between 15 and 30 degrees C (59 and 86 degrees F). Throw away any unused medicine after the expiration date. NOTE: This sheet is a summary. It may not cover all possible information. If you have questions about this medicine, talk to your doctor, pharmacist, or health care provider.  2018 Elsevier/Gold Standard (2015-10-02 13:20:23)  
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
## 2018-01-31 LAB — TSH: TSH: 1.37 u[IU]/mL (ref 0.450–4.500)

## 2018-02-27 ENCOUNTER — Ambulatory Visit: Payer: Self-pay | Attending: Nurse Practitioner | Admitting: Nurse Practitioner

## 2018-02-27 ENCOUNTER — Encounter: Payer: Self-pay | Admitting: Nurse Practitioner

## 2018-02-27 VITALS — BP 112/78 | HR 68 | Temp 98.3°F | Ht <= 58 in | Wt 191.4 lb

## 2018-02-27 DIAGNOSIS — L219 Seborrheic dermatitis, unspecified: Secondary | ICD-10-CM | POA: Insufficient documentation

## 2018-02-27 DIAGNOSIS — Z8249 Family history of ischemic heart disease and other diseases of the circulatory system: Secondary | ICD-10-CM | POA: Insufficient documentation

## 2018-02-27 DIAGNOSIS — D72829 Elevated white blood cell count, unspecified: Secondary | ICD-10-CM | POA: Insufficient documentation

## 2018-02-27 DIAGNOSIS — F321 Major depressive disorder, single episode, moderate: Secondary | ICD-10-CM

## 2018-02-27 DIAGNOSIS — J45909 Unspecified asthma, uncomplicated: Secondary | ICD-10-CM | POA: Insufficient documentation

## 2018-02-27 DIAGNOSIS — Z79899 Other long term (current) drug therapy: Secondary | ICD-10-CM | POA: Insufficient documentation

## 2018-02-27 DIAGNOSIS — Z833 Family history of diabetes mellitus: Secondary | ICD-10-CM | POA: Insufficient documentation

## 2018-02-27 DIAGNOSIS — F329 Major depressive disorder, single episode, unspecified: Secondary | ICD-10-CM | POA: Insufficient documentation

## 2018-02-27 MED ORDER — ESCITALOPRAM OXALATE 20 MG PO TABS: 20.0000 mg | ORAL_TABLET | Freq: Every day | ORAL | 1 refills | Status: DC

## 2018-02-27 MED ORDER — BETAMETHASONE VALERATE 0.1 % EX CREA: TOPICAL_CREAM | Freq: Two times a day (BID) | CUTANEOUS | 0 refills | Status: DC

## 2018-02-27 MED FILL — ESCITALOPRAM 10 MG TABLET: 10 | 30 days supply | Qty: 30 | Fill #1

## 2018-02-27 NOTE — Patient Instructions (Signed)
Seborrheic Dermatitis, Adult Seborrheic dermatitis is a skin disease that causes red, scaly patches. It usually occurs on the scalp, and it is often called dandruff. The patches may appear on other parts of the body. Skin patches tend to appear where there are many oil glands in the skin. Areas of the body that are commonly affected include:  Scalp.  Skin folds of the body.  Ears.  Eyebrows.  Neck.  Face.  Armpits.  The bearded area of men's faces.  The condition may come and go for no known reason, and it is often long-lasting (chronic). What are the causes? The cause of this condition is not known. What increases the risk? This condition is more likely to develop in people who:  Have certain conditions, such as: ? HIV (human immunodeficiency virus). ? AIDS (acquired immunodeficiency syndrome). ? Parkinson disease. ? Mood disorders, such as depression.  Are 40-60 years old.  What are the signs or symptoms? Symptoms of this condition include:  Thick scales on the scalp.  Redness on the face or in the armpits.  Skin that is flaky. The flakes may be white or yellow.  Skin that seems oily or dry but is not helped with moisturizers.  Itching or burning in the affected areas.  How is this diagnosed? This condition is diagnosed with a medical history and physical exam. A sample of your skin may be tested (skin biopsy). You may need to see a skin specialist (dermatologist). How is this treated? There is no cure for this condition, but treatment can help to manage the symptoms. You may get treatment to remove scales, lower the risk of skin infection, and reduce swelling or itching. Treatment may include:  Creams that reduce swelling and irritation (steroids).  Creams that reduce skin yeast.  Medicated shampoo, soaps, moisturizing creams, or ointments.  Medicated moisturizing creams or ointments.  Follow these instructions at home:  Apply over-the-counter and  prescription medicines only as told by your health care provider.  Use any medicated shampoo, soaps, skin creams, or ointments only as told by your health care provider.  Keep all follow-up visits as told by your health care provider. This is important. Contact a health care provider if:  Your symptoms do not improve with treatment.  Your symptoms get worse.  You have new symptoms. This information is not intended to replace advice given to you by your health care provider. Make sure you discuss any questions you have with your health care provider. Document Released: 04/29/2005 Document Revised: 11/17/2015 Document Reviewed: 08/17/2015 Elsevier Interactive Patient Education  2018 Elsevier Inc.  
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
## 2018-02-28 LAB — CBC
HEMOGLOBIN: 12.9 g/dL (ref 11.1–15.9)
Hematocrit: 40 % (ref 34.0–46.6)
MCH: 27.4 pg (ref 26.6–33.0)
MCHC: 32.3 g/dL (ref 31.5–35.7)
MCV: 85 fL (ref 79–97)
PLATELETS: 218 10*3/uL (ref 150–450)
RBC: 4.71 x10E6/uL (ref 3.77–5.28)
RDW: 12.9 % (ref 12.3–15.4)
WBC: 7.7 10*3/uL (ref 3.4–10.8)

## 2018-03-09 MED FILL — BETAMETHASONE VALERATE 0.1: 0.1 | 13 days supply | Qty: 30 | Fill #0

## 2018-03-14 ENCOUNTER — Encounter: Payer: Self-pay | Admitting: Nurse Practitioner

## 2018-03-16 NOTE — Telephone Encounter (Signed)
Mychart refill request.

## 2018-03-17 ENCOUNTER — Other Ambulatory Visit: Payer: Self-pay | Admitting: Nurse Practitioner

## 2018-03-17 DIAGNOSIS — F321 Major depressive disorder, single episode, moderate: Secondary | ICD-10-CM

## 2018-03-17 MED ORDER — ESCITALOPRAM OXALATE 20 MG PO TABS: 20.0000 mg | ORAL_TABLET | Freq: Every day | ORAL | 1 refills | Status: DC

## 2018-03-17 MED FILL — ESCITALOPRAM 20 MG TABLET: 20 | 30 days supply | Qty: 30 | Fill #0

## 2018-03-30 ENCOUNTER — Ambulatory Visit: Payer: Self-pay | Attending: Nurse Practitioner | Admitting: Nurse Practitioner

## 2018-03-30 ENCOUNTER — Encounter: Payer: Self-pay | Admitting: Nurse Practitioner

## 2018-03-30 VITALS — BP 118/83 | HR 69 | Temp 98.1°F | Ht <= 58 in | Wt 189.4 lb

## 2018-03-30 DIAGNOSIS — Z8249 Family history of ischemic heart disease and other diseases of the circulatory system: Secondary | ICD-10-CM | POA: Insufficient documentation

## 2018-03-30 DIAGNOSIS — F331 Major depressive disorder, recurrent, moderate: Secondary | ICD-10-CM | POA: Insufficient documentation

## 2018-03-30 DIAGNOSIS — J45909 Unspecified asthma, uncomplicated: Secondary | ICD-10-CM | POA: Insufficient documentation

## 2018-03-30 DIAGNOSIS — F419 Anxiety disorder, unspecified: Secondary | ICD-10-CM | POA: Insufficient documentation

## 2018-03-30 DIAGNOSIS — Z79899 Other long term (current) drug therapy: Secondary | ICD-10-CM | POA: Insufficient documentation

## 2018-03-30 MED ORDER — CITALOPRAM HYDROBROMIDE 20 MG PO TABS: 20.0000 mg | ORAL_TABLET | Freq: Every day | ORAL | 3 refills | Status: DC

## 2018-03-30 MED FILL — CITALOPRAM HBR 20 MG TABLET: 20 | 30 days supply | Qty: 30 | Fill #0
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
## 2018-03-30 NOTE — Patient Instructions (Addendum)
Taper Lexapro every 7 days by one whole tablet. On day 14 you can start Celexa.   Please call the office if you experience any of these side effects on day 10 or after:   ?Dizziness ?Fatigue ?Headache ?Nausea ?Agitation ?Anxiety ?Chills ?Diaphoresis- SWEATING ?Insomnia ?Irritability ?Myalgias- MUSCLE ACHES ?Paresthesias-NUMBNESS, TINGLING ?Tremor         Citalopram tablets What is this medicine? CITALOPRAM (sye TAL oh pram) is a medicine for depression. This medicine may be used for other purposes; ask your health care provider or pharmacist if you have questions. COMMON BRAND NAME(S): Celexa What should I tell my health care provider before I take this medicine? They need to know if you have any of these conditions: -bleeding disorders -bipolar disorder or a family history of bipolar disorder -glaucoma -heart disease -history of irregular heartbeat -kidney disease -liver disease -low levels of magnesium or potassium in the blood -receiving electroconvulsive therapy -seizures -suicidal thoughts, plans, or attempt; a previous suicide attempt by you or a family member -take medicines that treat or prevent blood clots -thyroid disease -an unusual or allergic reaction to citalopram, escitalopram, other medicines, foods, dyes, or preservatives -pregnant or trying to become pregnant -breast-feeding How should I use this medicine? Take this medicine by mouth with a glass of water. Follow the directions on the prescription label. You can take it with or without food. Take your medicine at regular intervals. Do not take your medicine more often than directed. Do not stop taking this medicine suddenly except upon the advice of your doctor. Stopping this medicine too quickly may cause serious side effects or your condition may worsen. A special MedGuide will be given to you by the pharmacist with each prescription and refill. Be sure to read this information carefully each  time. Talk to your pediatrician regarding the use of this medicine in children. Special care may be needed. Patients over 681 years old may have a stronger reaction and need a smaller dose. Overdosage: If you think you have taken too much of this medicine contact a poison control center or emergency room at once. NOTE: This medicine is only for you. Do not share this medicine with others. What if I miss a dose? If you miss a dose, take it as soon as you can. If it is almost time for your next dose, take only that dose. Do not take double or extra doses. What may interact with this medicine? Do not take this medicine with any of the following medications: -certain medicines for fungal infections like fluconazole, itraconazole, ketoconazole, posaconazole, voriconazole -cisapride -dofetilide -dronedarone -escitalopram -linezolid -MAOIs like Carbex, Eldepryl, Marplan, Nardil, and Parnate -methylene blue (injected into a vein) -pimozide -thioridazine -ziprasidone This medicine may also interact with the following medications: -alcohol -amphetamines -aspirin and aspirin-like medicines -carbamazepine -certain medicines for depression, anxiety, or psychotic disturbances -certain medicines for infections like chloroquine, clarithromycin, erythromycin, furazolidone, isoniazid, pentamidine -certain medicines for migraine headaches like almotriptan, eletriptan, frovatriptan, naratriptan, rizatriptan, sumatriptan, zolmitriptan -certain medicines for sleep -certain medicines that treat or prevent blood clots like dalteparin, enoxaparin, warfarin -cimetidine -diuretics -fentanyl -lithium -methadone -metoprolol -NSAIDs, medicines for pain and inflammation, like ibuprofen or naproxen -omeprazole -other medicines that prolong the QT interval (cause an abnormal heart rhythm) -procarbazine -rasagiline -supplements like St. John's wort, kava kava, valerian -tramadol -tryptophan This list may  not describe all possible interactions. Give your health care provider a list of all the medicines, herbs, non-prescription drugs, or dietary supplements you use. Also tell them if  you smoke, drink alcohol, or use illegal drugs. Some items may interact with your medicine. What should I watch for while using this medicine? Tell your doctor if your symptoms do not get better or if they get worse. Visit your doctor or health care professional for regular checks on your progress. Because it may take several weeks to see the full effects of this medicine, it is important to continue your treatment as prescribed by your doctor. Patients and their families should watch out for new or worsening thoughts of suicide or depression. Also watch out for sudden changes in feelings such as feeling anxious, agitated, panicky, irritable, hostile, aggressive, impulsive, severely restless, overly excited and hyperactive, or not being able to sleep. If this happens, especially at the beginning of treatment or after a change in dose, call your health care professional. Bonita Quin may get drowsy or dizzy. Do not drive, use machinery, or do anything that needs mental alertness until you know how this medicine affects you. Do not stand or sit up quickly, especially if you are an older patient. This reduces the risk of dizzy or fainting spells. Alcohol may interfere with the effect of this medicine. Avoid alcoholic drinks. Your mouth may get dry. Chewing sugarless gum or sucking hard candy, and drinking plenty of water will help. Contact your doctor if the problem does not go away or is severe. What side effects may I notice from receiving this medicine? Side effects that you should report to your doctor or health care professional as soon as possible: -allergic reactions like skin rash, itching or hives, swelling of the face, lips, or tongue -anxious -black, tarry stools -breathing problems -changes in vision -chest  pain -confusion -elevated mood, decreased need for sleep, racing thoughts, impulsive behavior -eye pain -fast, irregular heartbeat -feeling faint or lightheaded, falls -feeling agitated, angry, or irritable -hallucination, loss of contact with reality -loss of balance or coordination -loss of memory -painful or prolonged erections -restlessness, pacing, inability to keep still -seizures -stiff muscles -suicidal thoughts or other mood changes -trouble sleeping -unusual bleeding or bruising -unusually weak or tired -vomiting Side effects that usually do not require medical attention (report to your doctor or health care professional if they continue or are bothersome): -change in appetite or weight -change in sex drive or performance -dizziness -headache -increased sweating -indigestion, nausea -tremors This list may not describe all possible side effects. Call your doctor for medical advice about side effects. You may report side effects to FDA at 1-800-FDA-1088. Where should I keep my medicine? Keep out of reach of children. Store at room temperature between 15 and 30 degrees C (59 and 86 degrees F). Throw away any unused medicine after the expiration date. NOTE: This sheet is a summary. It may not cover all possible information. If you have questions about this medicine, talk to your doctor, pharmacist, or health care provider.  2018 Elsevier/Gold Standard (2015-10-02 13:18:52)

## 2018-04-15 ENCOUNTER — Ambulatory Visit: Payer: Self-pay | Attending: Nurse Practitioner

## 2018-04-27 ENCOUNTER — Encounter: Payer: Self-pay | Admitting: Nurse Practitioner

## 2018-04-27 ENCOUNTER — Ambulatory Visit: Payer: Medicaid Other | Admitting: Nurse Practitioner

## 2018-04-27 ENCOUNTER — Ambulatory Visit: Payer: Self-pay | Attending: Nurse Practitioner | Admitting: Nurse Practitioner

## 2018-04-27 VITALS — BP 110/73 | HR 81 | Temp 99.1°F | Resp 18 | Ht 58.5 in | Wt 189.0 lb

## 2018-04-27 DIAGNOSIS — Z8249 Family history of ischemic heart disease and other diseases of the circulatory system: Secondary | ICD-10-CM | POA: Insufficient documentation

## 2018-04-27 DIAGNOSIS — N3941 Urge incontinence: Secondary | ICD-10-CM | POA: Insufficient documentation

## 2018-04-27 DIAGNOSIS — F331 Major depressive disorder, recurrent, moderate: Secondary | ICD-10-CM | POA: Insufficient documentation

## 2018-04-27 DIAGNOSIS — Z9889 Other specified postprocedural states: Secondary | ICD-10-CM | POA: Insufficient documentation

## 2018-04-27 MED ORDER — CITALOPRAM HYDROBROMIDE 20 MG PO TABS
20.0000 mg | ORAL_TABLET | Freq: Every day | ORAL | 1 refills | Status: DC
Start: 2018-04-27 — End: 2018-10-07

## 2018-04-27 MED FILL — CITALOPRAM HBR 20 MG TABLET: 20 | 90 days supply | Qty: 90 | Fill #0

## 2018-04-27 NOTE — Patient Instructions (Signed)
Urinary Incontinence Urinary incontinence is the involuntary loss of urine from your bladder. What are the causes? There are many causes of urinary incontinence. They include:  Medicines.  Infections.  Prostatic enlargement, leading to overflow of urine from your bladder.  Surgery.  Neurological diseases.  Emotional factors.  What are the signs or symptoms? Urinary Incontinence can be divided into four types: 1. Urge incontinence. Urge incontinence is the involuntary loss of urine before you have the opportunity to go to the bathroom. There is a sudden urge to void but not enough time to reach a bathroom. 2. Stress incontinence. Stress incontinence is the sudden loss of urine with any activity that forces urine to pass. It is commonly caused by anatomical changes to the pelvis and sphincter areas of your body. 3. Overflow incontinence. Overflow incontinence is the loss of urine from an obstructed opening to your bladder. This results in a backup of urine and a resultant buildup of pressure within the bladder. When the pressure within the bladder exceeds the closing pressure of the sphincter, the urine overflows, which causes incontinence, similar to water overflowing a dam. 4. Total incontinence. Total incontinence is the loss of urine as a result of the inability to store urine within your bladder.  How is this diagnosed? Evaluating the cause of incontinence may require:  A thorough and complete medical and obstetric history.  A complete physical exam.  Laboratory tests such as a urine culture and sensitivities.  When additional tests are indicated, they can include:  An ultrasound exam.  Kidney and bladder X-rays.  Cystoscopy. This is an exam of the bladder using a narrow scope.  Urodynamic testing to test the nerve function to the bladder and sphincter areas.  How is this treated? Treatment for urinary incontinence depends on the cause:  For urge incontinence caused  by a bacterial infection, antibiotics will be prescribed. If the urge incontinence is related to medicines you take, your health care provider may have you change the medicine.  For stress incontinence, surgery to re-establish anatomical support to the bladder or sphincter, or both, will often correct the condition.  For overflow incontinence caused by an enlarged prostate, an operation to open the channel through the enlarged prostate will allow the flow of urine out of the bladder. In women with fibroids, a hysterectomy may be recommended.  For total incontinence, surgery on your urinary sphincter may help. An artificial urinary sphincter (an inflatable cuff placed around the urethra) may be required. In women who have developed a hole-like passage between their bladder and vagina (vesicovaginal fistula), surgery to close the fistula often is required.  Follow these instructions at home:  Normal daily hygiene and the use of pads or adult diapers that are changed regularly will help prevent odors and skin damage.  Avoid caffeine. It can overstimulate your bladder.  Use the bathroom regularly. Try about every 2-3 hours to go to the bathroom, even if you do not feel the need to do so. Take time to empty your bladder completely. After urinating, wait a minute. Then try to urinate again.  For causes involving nerve dysfunction, keep a log of the medicines you take and a journal of the times you go to the bathroom. Contact a health care provider if:  You experience worsening of pain instead of improvement in pain after your procedure.  Your incontinence becomes worse instead of better. Get help right away if:  You experience fever or shaking chills.  You are unable to   pass your urine.  You have redness spreading into your groin or down into your thighs. This information is not intended to replace advice given to you by your health care provider. Make sure you discuss any questions you have  with your health care provider. Document Released: 06/06/2004 Document Revised: 12/08/2015 Document Reviewed: 10/06/2012 Elsevier Interactive Patient Education  2018 [employer] Kegel Exercises Kegel exercises help strengthen the muscles that support the rectum, vagina, small intestine, bladder, and uterus. Doing Kegel exercises can help:  Improve bladder and bowel control.  Improve sexual response.  Reduce problems and discomfort during pregnancy.  Kegel exercises involve squeezing your pelvic floor muscles, which are the same muscles you squeeze when you try to stop the flow of urine. The exercises can be done while sitting, standing, or lying down, but it is best to vary your position. Phase 1 exercises 5. Squeeze your pelvic floor muscles tight. You should feel a tight lift in your rectal area. If you are a female, you should also feel a tightness in your vaginal area. Keep your stomach, buttocks, and legs relaxed. 6. Hold the muscles tight for up to 10 seconds. 7. Relax your muscles. Repeat this exercise 50 times a day or as many times as told by your health care provider. Continue to do this exercise for at least 4-6 weeks or for as long as told by your health care provider. This information is not intended to replace advice given to you by your health care provider. Make sure you discuss any questions you have with your health care provider. Document Released: 04/15/2012 Document Revised: 12/23/2015 Document Reviewed: 03/19/2015 Elsevier Interactive Patient Education  2018 [employer] Inc[employer]  
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
## 2018-04-28 LAB — URINALYSIS, COMPLETE
BILIRUBIN UA: NEGATIVE
GLUCOSE, UA: NEGATIVE
KETONES UA: NEGATIVE
Leukocytes, UA: NEGATIVE
NITRITE UA: NEGATIVE
PROTEIN UA: NEGATIVE
RBC, UA: NEGATIVE
SPEC GRAV UA: 1.029 (ref 1.005–1.030)
Urobilinogen, Ur: 1 mg/dL (ref 0.2–1.0)
pH, UA: 6.5 (ref 5.0–7.5)

## 2018-04-28 LAB — MICROSCOPIC EXAMINATION
Casts: NONE SEEN /lpf
Epithelial Cells (non renal): >10 /hpf — AB (ref 0–10)

## 2018-05-22 ENCOUNTER — Ambulatory Visit: Payer: Self-pay | Attending: Nurse Practitioner | Admitting: Nurse Practitioner

## 2018-05-22 ENCOUNTER — Encounter: Payer: Self-pay | Admitting: Nurse Practitioner

## 2018-05-22 VITALS — BP 111/74 | HR 63 | Temp 97.9°F | Ht 58.5 in | Wt 190.6 lb

## 2018-05-22 DIAGNOSIS — Z1331 Encounter for screening for depression: Secondary | ICD-10-CM | POA: Insufficient documentation

## 2018-05-22 DIAGNOSIS — F331 Major depressive disorder, recurrent, moderate: Secondary | ICD-10-CM | POA: Insufficient documentation

## 2018-05-22 DIAGNOSIS — N393 Stress incontinence (female) (male): Secondary | ICD-10-CM | POA: Insufficient documentation

## 2018-05-22 DIAGNOSIS — M791 Myalgia, unspecified site: Secondary | ICD-10-CM | POA: Insufficient documentation

## 2018-05-22 DIAGNOSIS — Z79899 Other long term (current) drug therapy: Secondary | ICD-10-CM | POA: Insufficient documentation

## 2018-05-22 DIAGNOSIS — J45909 Unspecified asthma, uncomplicated: Secondary | ICD-10-CM | POA: Insufficient documentation

## 2018-05-22 NOTE — Patient Instructions (Signed)
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
## 2018-05-24 ENCOUNTER — Encounter: Payer: Self-pay | Admitting: Nurse Practitioner

## 2018-06-23 ENCOUNTER — Encounter: Payer: Self-pay | Admitting: Nurse Practitioner

## 2018-06-23 ENCOUNTER — Ambulatory Visit: Payer: Medicaid Other | Attending: Nurse Practitioner | Admitting: Nurse Practitioner

## 2018-06-23 VITALS — BP 114/78 | HR 76 | Ht 58.5 in | Wt 195.6 lb

## 2018-06-23 DIAGNOSIS — J45909 Unspecified asthma, uncomplicated: Secondary | ICD-10-CM | POA: Insufficient documentation

## 2018-06-23 DIAGNOSIS — Z01419 Encounter for gynecological examination (general) (routine) without abnormal findings: Secondary | ICD-10-CM

## 2018-06-23 DIAGNOSIS — Z0000 Encounter for general adult medical examination without abnormal findings: Secondary | ICD-10-CM

## 2018-06-23 DIAGNOSIS — Z79899 Other long term (current) drug therapy: Secondary | ICD-10-CM | POA: Insufficient documentation

## 2018-06-23 DIAGNOSIS — F329 Major depressive disorder, single episode, unspecified: Secondary | ICD-10-CM | POA: Insufficient documentation

## 2018-06-23 DIAGNOSIS — Z124 Encounter for screening for malignant neoplasm of cervix: Secondary | ICD-10-CM
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
## 2018-06-25 LAB — CBC
HEMATOCRIT: 36 % (ref 34.0–46.6)
Hemoglobin: 11.9 g/dL (ref 11.1–15.9)
MCH: 27.8 pg (ref 26.6–33.0)
MCHC: 33.1 g/dL (ref 31.5–35.7)
MCV: 84 fL (ref 79–97)
Platelets: 239 10*3/uL (ref 150–450)
RBC: 4.28 x10E6/uL (ref 3.77–5.28)
RDW: 13 % (ref 11.7–15.4)
WBC: 7.9 10*3/uL (ref 3.4–10.8)

## 2018-06-25 LAB — CMP14+EGFR
ALBUMIN: 4.3 g/dL (ref 3.9–5.0)
ALT: 11 IU/L (ref 0–32)
AST: 15 IU/L (ref 0–40)
Albumin/Globulin Ratio: 1.6 (ref 1.2–2.2)
Alkaline Phosphatase: 61 IU/L (ref 39–117)
BUN / CREAT RATIO: 17 (ref 9–23)
BUN: 12 mg/dL (ref 6–20)
CHLORIDE: 102 mmol/L (ref 96–106)
CO2: 23 mmol/L (ref 20–29)
Calcium: 9.3 mg/dL (ref 8.7–10.2)
Creatinine, Ser: 0.71 mg/dL (ref 0.57–1.00)
GFR calc Af Amer: 138 mL/min/{1.73_m2} (ref >59)
GFR calc non Af Amer: 120 mL/min/{1.73_m2} (ref >59)
GLUCOSE: 90 mg/dL (ref 65–99)
Globulin, Total: 2.7 g/dL (ref 1.5–4.5)
Potassium: 4.4 mmol/L (ref 3.5–5.2)
Sodium: 140 mmol/L (ref 134–144)
Total Protein: 7 g/dL (ref 6.0–8.5)

## 2018-06-26 LAB — CYTOLOGY - PAP
Bacterial vaginitis: NEGATIVE
CANDIDA VAGINITIS: NEGATIVE
Chlamydia: NEGATIVE
Diagnosis: UNDETERMINED — AB
HPV (WINDOPATH): DETECTED — AB
Neisseria Gonorrhea: NEGATIVE
TRICH (WINDOWPATH): NEGATIVE

## 2018-07-24 ENCOUNTER — Encounter: Payer: Self-pay | Admitting: Nurse Practitioner

## 2018-07-24 ENCOUNTER — Other Ambulatory Visit: Payer: Self-pay | Admitting: Family Medicine

## 2018-07-24 MED ORDER — BUTALBITAL-APAP-CAFFEINE 50-325-40 MG PO TABS: 1.0000 | ORAL_TABLET | Freq: Three times a day (TID) | ORAL | 0 refills | Status: DC | PRN

## 2018-07-24 MED FILL — BUTALB-ACETAMIN-CAFF 50-325: 50-325-40 | 10 days supply | Qty: 30 | Fill #0

## 2018-08-13 IMAGING — CT CT ANKLE*L* W/O CM
3 of 4 series · 12 of 33 positions shown, 14 images · non-contrast
Comparison: 07/06/2017 radiographs

CLINICAL DATA: 21-year-old female with acute LEFT ankle fracture.

EXAM:
CT OF THE LEFT ANKLE WITHOUT CONTRAST
TECHNIQUE: Multidetector CT imaging of the left ankle was performed according
to the standard protocol. Multiplanar CT image reconstructions were
also generated.

[Series 8: lower ext cor bone · coronal · 0.34mm/px · 3 of 126 slices shown]
[im 20/126  bone]
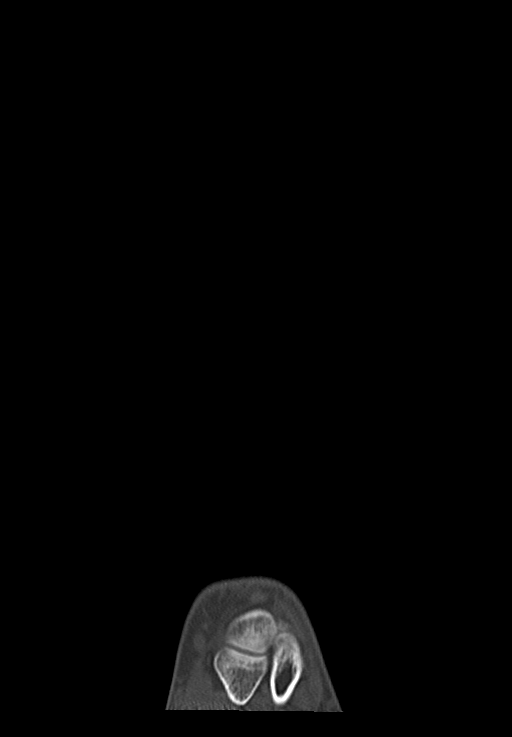
[im 63/126  bone]
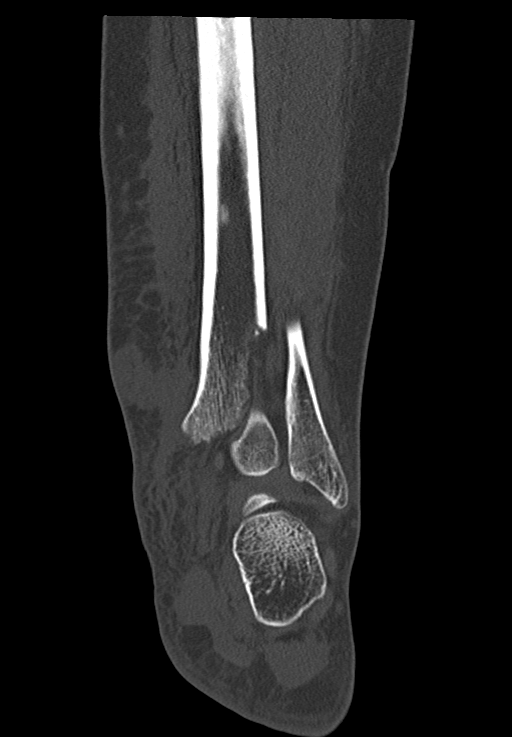
[im 107/126  bone]
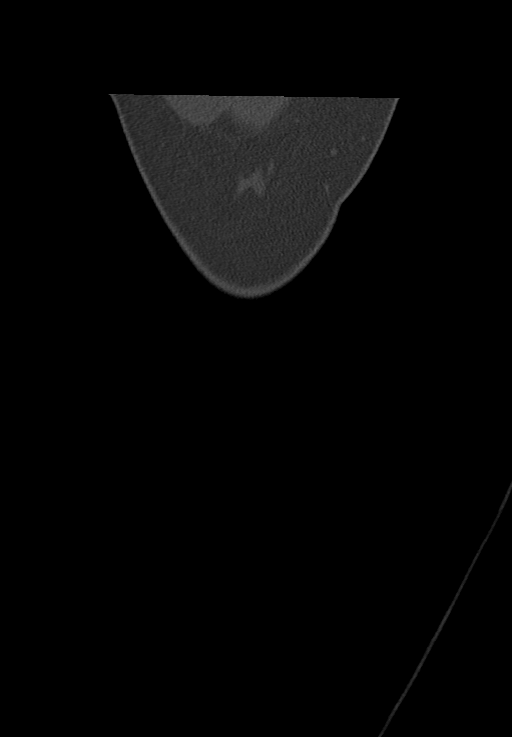

[Series 11: lower ext sag st · sagittal · 0.29mm/px · 5 of 84 slices shown, 6 images]
[im 28/84  bone]
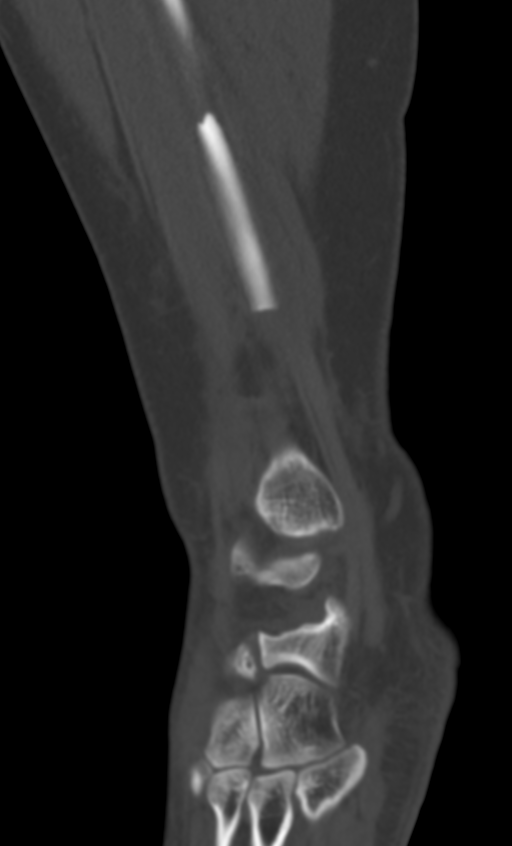
[im 35/84  bone]
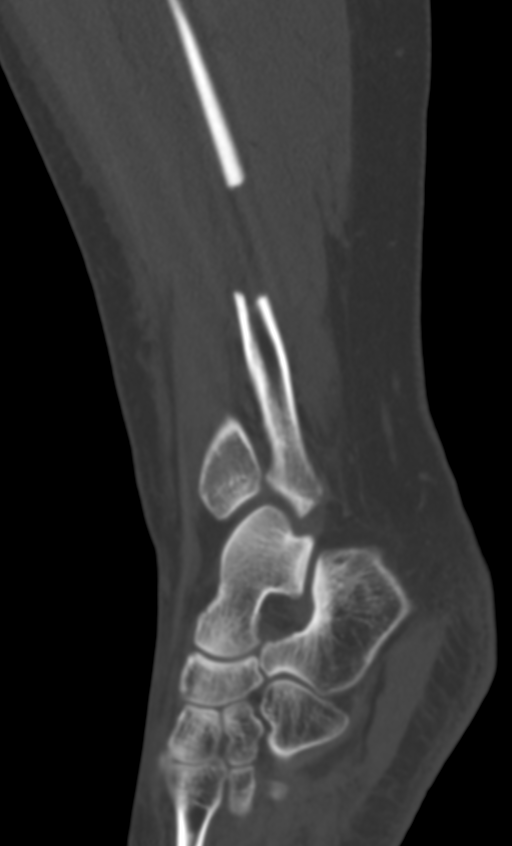
[im 42/84  soft-tissue]
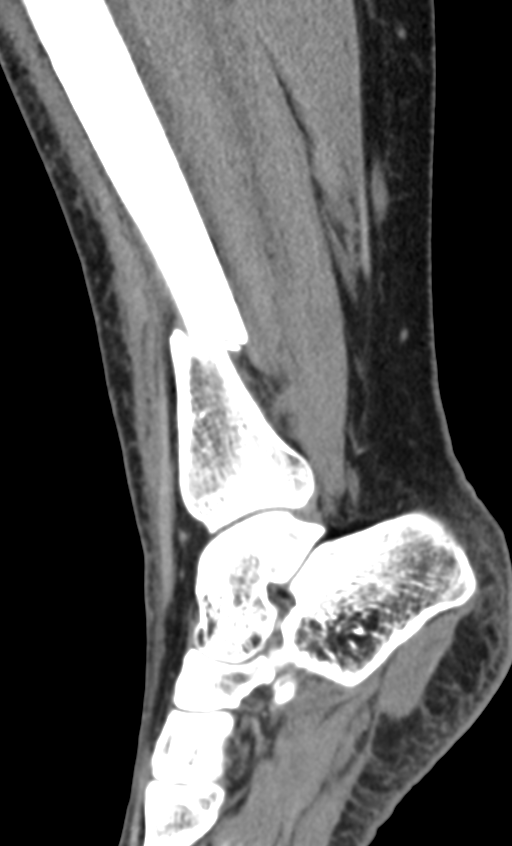
[im 42/84  bone]
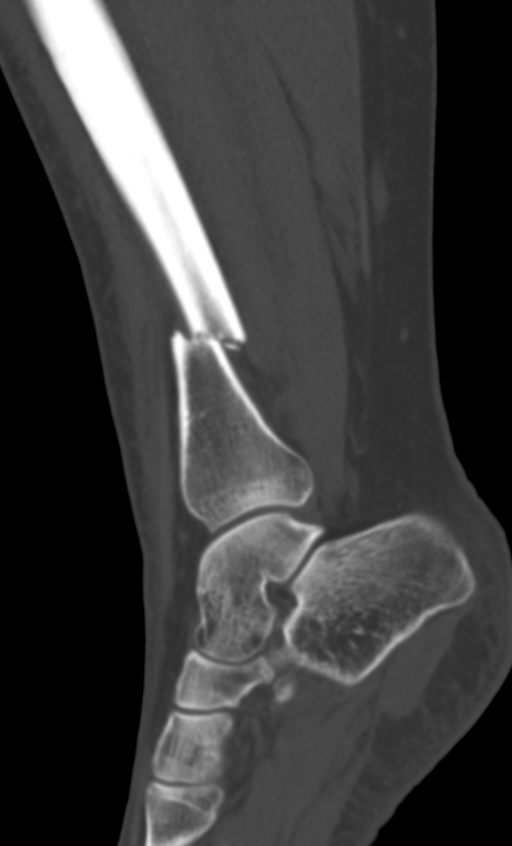
[im 49/84  bone]
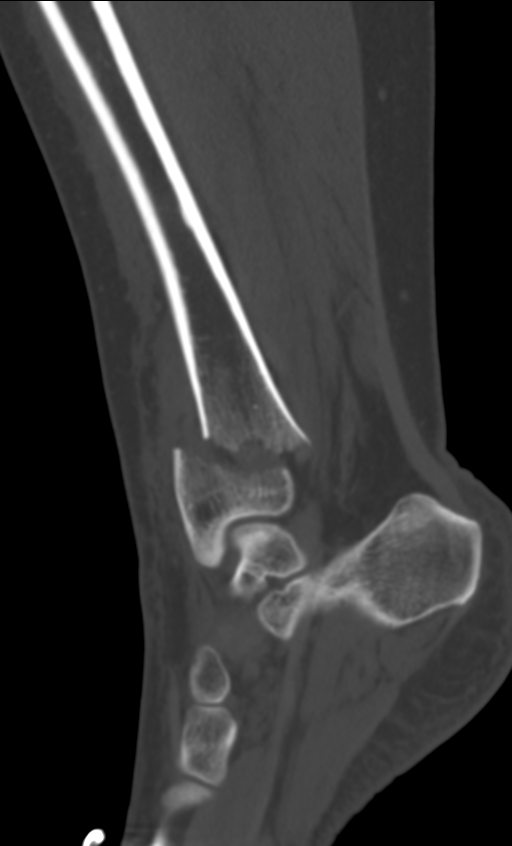
[im 56/84  bone]
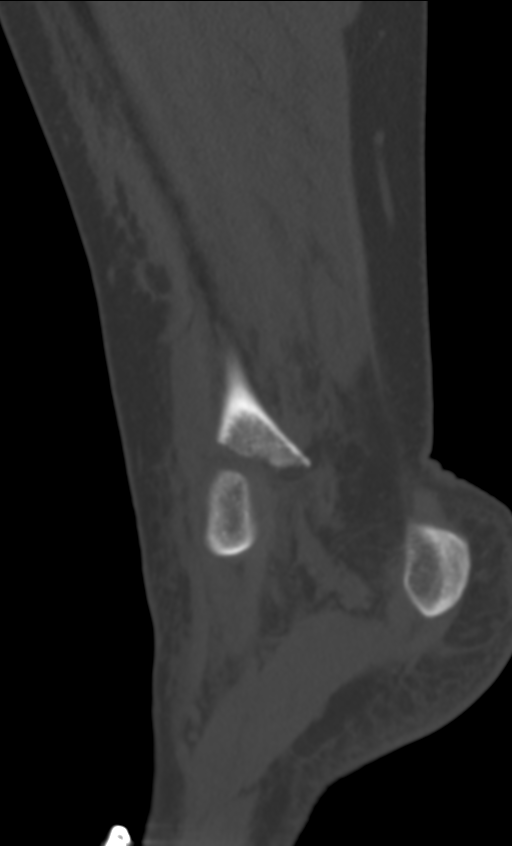

[Series 12: lower ext 1.5 st · axial · 0.41mm/px · z∈[+415,+584]mm · 4 of 163 slices shown, 5 images]
[im 25/163  soft-tissue]
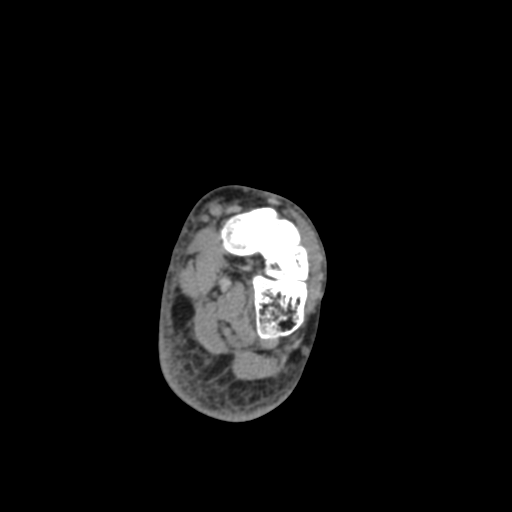
[im 25/163  bone]
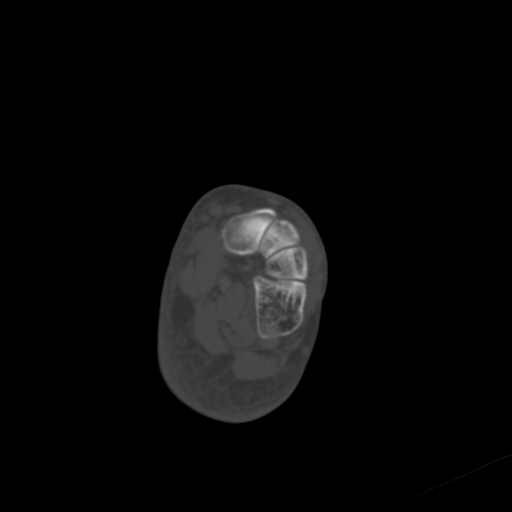
[im 63/163  bone]
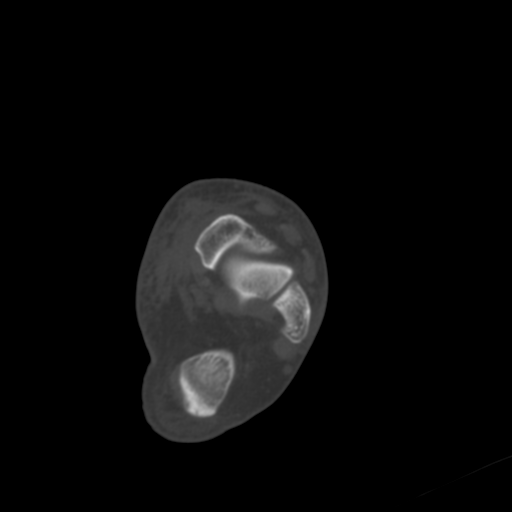
[im 100/163  bone]
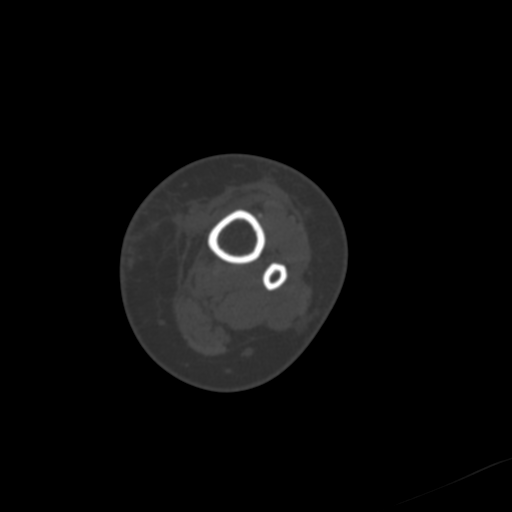
[im 138/163  bone]
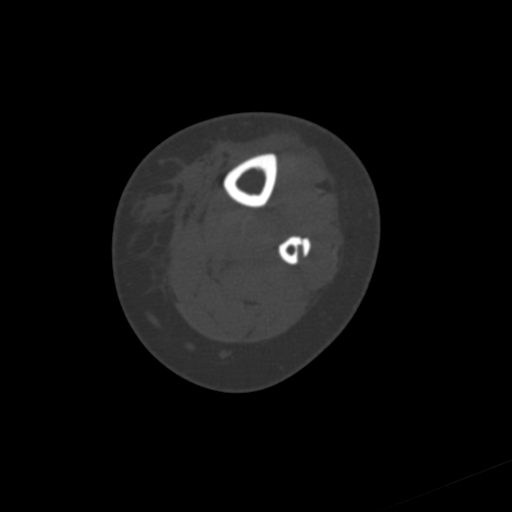

[12 of 33 positions shown; findings below may reference images not displayed]

FINDINGS: An oblique fracture of the distal tibial metadiaphysis is noted with
1 cm anterior displacement. There is very mild widening of the
LATERAL tibiotalar joint.

Two separate fractures of the distal fibular diaphysis are noted.

The more proximal oblique distal fibular fracture is displaced
laterally by 2 mm.

The more distal transverse distal fibular fracture is displaced
medially by 1 cm.

No other fractures are identified.

Ligaments

Suboptimally assessed by CT.
IMPRESSION: Distal tibial and fibular fractures as described above. Very mild
widening of the LATERAL tibiotalar joint.
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
## 2018-08-28 ENCOUNTER — Encounter: Payer: Self-pay | Admitting: Nurse Practitioner

## 2018-09-01 MED FILL — CITALOPRAM HBR 20 MG TABLET: 20 | 90 days supply | Qty: 90 | Fill #1

## 2018-10-07 ENCOUNTER — Other Ambulatory Visit: Payer: Self-pay

## 2018-10-07 ENCOUNTER — Ambulatory Visit: Payer: Self-pay | Attending: Nurse Practitioner | Admitting: Nurse Practitioner

## 2018-10-07 ENCOUNTER — Encounter: Payer: Self-pay | Admitting: Nurse Practitioner

## 2018-10-07 DIAGNOSIS — J3089 Other allergic rhinitis: Secondary | ICD-10-CM

## 2018-10-07 DIAGNOSIS — F331 Major depressive disorder, recurrent, moderate: Secondary | ICD-10-CM

## 2018-10-07 MED ORDER — CETIRIZINE HCL 10 MG PO TABS: 10.0000 mg | ORAL_TABLET | Freq: Every day | ORAL | 11 refills | Status: DC

## 2018-10-07 MED ORDER — CITALOPRAM HYDROBROMIDE 20 MG PO TABS
20.0000 mg | ORAL_TABLET | Freq: Every day | ORAL | 1 refills | Status: DC
Start: 2018-10-07 — End: 2018-10-17

## 2018-10-07 MED FILL — ?CITALOPRAM HBR 20 MG TABLE: 20 | 30 days supply | Qty: 30 | Fill #0
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
## 2018-10-17 ENCOUNTER — Encounter: Payer: Self-pay | Admitting: Nurse Practitioner

## 2018-10-17 ENCOUNTER — Other Ambulatory Visit: Payer: Self-pay | Admitting: Nurse Practitioner

## 2018-10-17 DIAGNOSIS — F331 Major depressive disorder, recurrent, moderate: Secondary | ICD-10-CM

## 2018-10-17 MED ORDER — ALBUTEROL SULFATE HFA 108 (90 BASE) MCG/ACT IN AERS: 2.0000 | INHALATION_SPRAY | Freq: Four times a day (QID) | RESPIRATORY_TRACT | 1 refills | Status: DC | PRN

## 2018-10-17 MED ORDER — CITALOPRAM HYDROBROMIDE 40 MG PO TABS
40.0000 mg | ORAL_TABLET | Freq: Every day | ORAL | 1 refills | Status: DC
Start: 2018-10-17 — End: 2018-12-03

## 2018-10-19 MED FILL — !VENTOLIN HFA INHALER: 108 (90 BAS | 25 days supply | Qty: 18 | Fill #0

## 2018-12-03 ENCOUNTER — Other Ambulatory Visit: Payer: Self-pay | Admitting: Nurse Practitioner

## 2018-12-03 DIAGNOSIS — F331 Major depressive disorder, recurrent, moderate: Secondary | ICD-10-CM

## 2018-12-03 MED ORDER — CITALOPRAM HYDROBROMIDE 40 MG PO TABS: 40.0000 mg | ORAL_TABLET | Freq: Every day | ORAL | 1 refills | Status: DC

## 2018-12-03 MED FILL — CITALOPRAM HBR 40 MG TABLET: 40 | 90 days supply | Qty: 90 | Fill #0

## 2018-12-15 ENCOUNTER — Other Ambulatory Visit: Payer: Self-pay | Admitting: Nurse Practitioner

## 2018-12-15 DIAGNOSIS — E669 Obesity, unspecified: Secondary | ICD-10-CM

## 2018-12-15 DIAGNOSIS — F331 Major depressive disorder, recurrent, moderate: Secondary | ICD-10-CM

## 2018-12-15 DIAGNOSIS — Z130 Encounter for screening for diseases of the blood and blood-forming organs and certain disorders involving the immune mechanism: Secondary | ICD-10-CM

## 2018-12-15 DIAGNOSIS — Z13220 Encounter for screening for lipoid disorders: Secondary | ICD-10-CM

## 2018-12-15 MED ORDER — CITALOPRAM HYDROBROMIDE 40 MG PO TABS: 40.0000 mg | ORAL_TABLET | Freq: Every day | ORAL | 1 refills | Status: DC

## 2018-12-16 MED FILL — ?CITALOPRAM HBR 40 MG TABLE: 40 | 90 days supply | Qty: 90 | Fill #0

## 2018-12-17 ENCOUNTER — Other Ambulatory Visit: Payer: Self-pay

## 2018-12-17 ENCOUNTER — Ambulatory Visit: Payer: Self-pay | Attending: Family Medicine

## 2018-12-17 DIAGNOSIS — E669 Obesity, unspecified: Secondary | ICD-10-CM

## 2018-12-17 DIAGNOSIS — Z130 Encounter for screening for diseases of the blood and blood-forming organs and certain disorders involving the immune mechanism: Secondary | ICD-10-CM

## 2018-12-17 DIAGNOSIS — Z13220 Encounter for screening for lipoid disorders: Secondary | ICD-10-CM

## 2018-12-18 LAB — CBC
Hematocrit: 38.8 % (ref 34.0–46.6)
Hemoglobin: 12.9 g/dL (ref 11.1–15.9)
MCH: 28.2 pg (ref 26.6–33.0)
MCHC: 33.2 g/dL (ref 31.5–35.7)
MCV: 85 fL (ref 79–97)
Platelets: 218 10*3/uL (ref 150–450)
RBC: 4.57 x10E6/uL (ref 3.77–5.28)
RDW: 12.6 % (ref 11.7–15.4)
WBC: 8.4 10*3/uL (ref 3.4–10.8)

## 2018-12-18 LAB — LIPID PANEL
Chol/HDL Ratio: 3.9 ratio (ref 0.0–4.4)
Cholesterol, Total: 206 mg/dL — ABNORMAL HIGH (ref 100–199)
HDL: 53 mg/dL (ref >39)
LDL Calculated: 133 mg/dL — ABNORMAL HIGH (ref 0–99)
Triglycerides: 100 mg/dL (ref 0–149)
VLDL Cholesterol Cal: 20 mg/dL (ref 5–40)

## 2018-12-18 LAB — CMP14+EGFR
ALT: 7 IU/L (ref 0–32)
AST: 11 IU/L (ref 0–40)
Albumin/Globulin Ratio: 1.4 (ref 1.2–2.2)
Albumin: 4.2 g/dL (ref 3.9–5.0)
Alkaline Phosphatase: 75 IU/L (ref 39–117)
BUN/Creatinine Ratio: 15 (ref 9–23)
BUN: 14 mg/dL (ref 6–20)
Bilirubin Total: 0.2 mg/dL (ref 0.0–1.2)
CO2: 24 mmol/L (ref 20–29)
Calcium: 9.4 mg/dL (ref 8.7–10.2)
Chloride: 102 mmol/L (ref 96–106)
Creatinine, Ser: 0.92 mg/dL (ref 0.57–1.00)
GFR calc Af Amer: 100 mL/min/{1.73_m2} (ref >59)
GFR calc non Af Amer: 87 mL/min/{1.73_m2} (ref >59)
Globulin, Total: 3.1 g/dL (ref 1.5–4.5)
Glucose: 103 mg/dL — ABNORMAL HIGH (ref 65–99)
Potassium: 4.7 mmol/L (ref 3.5–5.2)
Sodium: 139 mmol/L (ref 134–144)
Total Protein: 7.3 g/dL (ref 6.0–8.5)

## 2019-01-12 ENCOUNTER — Ambulatory Visit: Payer: Self-pay | Attending: Nurse Practitioner | Admitting: Nurse Practitioner

## 2019-01-12 ENCOUNTER — Other Ambulatory Visit: Payer: Self-pay

## 2019-01-12 ENCOUNTER — Encounter: Payer: Self-pay | Admitting: Nurse Practitioner

## 2019-01-12 ENCOUNTER — Ambulatory Visit (HOSPITAL_BASED_OUTPATIENT_CLINIC_OR_DEPARTMENT_OTHER): Payer: Self-pay | Admitting: Pharmacist

## 2019-01-12 VITALS — BP 111/77 | HR 62 | Temp 98.0°F | Ht 58.5 in | Wt 200.4 lb

## 2019-01-12 DIAGNOSIS — Z23 Encounter for immunization: Secondary | ICD-10-CM

## 2019-01-12 DIAGNOSIS — F5104 Psychophysiologic insomnia: Secondary | ICD-10-CM

## 2019-01-12 DIAGNOSIS — L409 Psoriasis, unspecified: Secondary | ICD-10-CM | POA: Insufficient documentation

## 2019-01-12 DIAGNOSIS — F331 Major depressive disorder, recurrent, moderate: Secondary | ICD-10-CM

## 2019-01-12 MED ORDER — BETAMETHASONE VALERATE 0.1 % EX CREA: TOPICAL_CREAM | Freq: Two times a day (BID) | CUTANEOUS | 0 refills | Status: DC

## 2019-01-12 MED ORDER — TRAZODONE HCL 50 MG PO TABS: 25.0000 mg | ORAL_TABLET | Freq: Every evening | ORAL | 3 refills | Status: DC | PRN

## 2019-01-12 NOTE — Progress Notes (Signed)
Patient presents for vaccination against influenza per orders of Zelda. Consent given. Counseling provided. No contraindications exists. Vaccine administered without incident.   
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
## 2019-01-13 MED FILL — ?traZODONE HCL 50MG TAB: 50 | 30 days supply | Qty: 30 | Fill #0

## 2019-01-13 MED FILL — BETAMETHASONE VALERATE 0.1: 0.1 | 15 days supply | Qty: 30 | Fill #0

## 2019-01-20 ENCOUNTER — Other Ambulatory Visit: Payer: Self-pay | Admitting: Nurse Practitioner

## 2019-01-20 MED ORDER — TOPIRAMATE 25 MG PO TABS: 25.0000 mg | ORAL_TABLET | Freq: Every day | ORAL | 1 refills | Status: DC

## 2019-03-17 ENCOUNTER — Other Ambulatory Visit: Payer: Self-pay | Admitting: Nurse Practitioner

## 2019-03-17 DIAGNOSIS — F331 Major depressive disorder, recurrent, moderate: Secondary | ICD-10-CM

## 2019-03-17 MED ORDER — NAPROXEN 500 MG PO TABS: 500.0000 mg | ORAL_TABLET | Freq: Two times a day (BID) | ORAL | 1 refills | Status: DC

## 2019-03-17 MED ORDER — CITALOPRAM HYDROBROMIDE 40 MG PO TABS: 40.0000 mg | ORAL_TABLET | Freq: Every day | ORAL | 1 refills | Status: DC

## 2019-03-17 MED ORDER — TOPIRAMATE 100 MG PO TABS: 100.0000 mg | ORAL_TABLET | Freq: Every day | ORAL | 1 refills | Status: DC

## 2019-03-18 MED FILL — ?CITALOPRAM HBR 40 MG TABLE: 40 | 90 days supply | Qty: 90 | Fill #0

## 2019-03-18 MED FILL — TOPIRAMATE 100 MG TABLET: 100 | 30 days supply | Qty: 30 | Fill #0

## 2019-03-18 MED FILL — NAPROXEN 500 MG TABS: 500 | 30 days supply | Qty: 60 | Fill #0

## 2019-07-13 MED FILL — ?NAPROXEN 500 MG TABS: 500 | 30 days supply | Qty: 60 | Fill #1

## 2019-07-13 MED FILL — CITALOPRAM HBR 40 MG TABLET: 40 | 90 days supply | Qty: 90 | Fill #1

## 2019-07-20 ENCOUNTER — Ambulatory Visit: Payer: Medicaid Other | Admitting: Nurse Practitioner

## 2019-08-03 ENCOUNTER — Ambulatory Visit: Payer: Medicaid Other | Admitting: Nurse Practitioner

## 2019-08-24 ENCOUNTER — Encounter: Payer: Self-pay | Admitting: Nurse Practitioner

## 2019-08-24 ENCOUNTER — Ambulatory Visit: Payer: Self-pay | Attending: Nurse Practitioner | Admitting: Nurse Practitioner

## 2019-08-24 ENCOUNTER — Other Ambulatory Visit: Payer: Self-pay

## 2019-08-24 VITALS — BP 104/68 | HR 77 | Temp 97.9°F | Ht 58.5 in | Wt 186.0 lb

## 2019-08-24 DIAGNOSIS — Z130 Encounter for screening for diseases of the blood and blood-forming organs and certain disorders involving the immune mechanism: Secondary | ICD-10-CM

## 2019-08-24 DIAGNOSIS — L409 Psoriasis, unspecified: Secondary | ICD-10-CM

## 2019-08-24 DIAGNOSIS — Z124 Encounter for screening for malignant neoplasm of cervix: Secondary | ICD-10-CM

## 2019-08-24 MED ORDER — BETAMETHASONE VALERATE 0.1 % EX CREA: TOPICAL_CREAM | Freq: Two times a day (BID) | CUTANEOUS | 1 refills | Status: DC

## 2019-08-24 MED FILL — BETAMETHASONE VALERATE 0.1: 0.1 | 30 days supply | Qty: 15 | Fill #0

## 2019-08-24 NOTE — Patient Instructions (Signed)
Health Maintenance, Female Adopting a healthy lifestyle and getting preventive care are important in promoting health and wellness. Ask your health care provider about:  The right schedule for you to have regular tests and exams.  Things you can do on your own to prevent diseases and keep yourself healthy. What should I know about diet, weight, and exercise? Eat a healthy diet   Eat a diet that includes plenty of vegetables, fruits, low-fat dairy products, and lean protein.  Do not eat a lot of foods that are high in solid fats, added sugars, or sodium. Maintain a healthy weight Body mass index (BMI) is used to identify weight problems. It estimates body fat based on height and weight. Your health care provider can help determine your BMI and help you achieve or maintain a healthy weight. Get regular exercise Get regular exercise. This is one of the most important things you can do for your health. Most adults should:  Exercise for at least 150 minutes each week. The exercise should increase your heart rate and make you sweat (moderate-intensity exercise).  Do strengthening exercises at least twice a week. This is in addition to the moderate-intensity exercise.  Spend less time sitting. Even light physical activity can be beneficial. Watch cholesterol and blood lipids Have your blood tested for lipids and cholesterol at 26 years of age, then have this test every 5 years. Have your cholesterol levels checked more often if:  Your lipid or cholesterol levels are high.  You are older than 26 years of age.  You are at high risk for heart disease. What should I know about cancer screening? Depending on your health history and family history, you may need to have cancer screening at various ages. This may include screening for:  Breast cancer.  Cervical cancer.  Colorectal cancer.  Skin cancer.  Lung cancer. What should I know about heart disease, diabetes, and high blood  pressure? Blood pressure and heart disease  High blood pressure causes heart disease and increases the risk of stroke. This is more likely to develop in people who have high blood pressure readings, are of African descent, or are overweight.  Have your blood pressure checked: ? Every 3-5 years if you are 18-39 years of age. ? Every year if you are 40 years old or older. Diabetes Have regular diabetes screenings. This checks your fasting blood sugar level. Have the screening done:  Once every three years after age 40 if you are at a normal weight and have a low risk for diabetes.  More often and at a younger age if you are overweight or have a high risk for diabetes. What should I know about preventing infection? Hepatitis B If you have a higher risk for hepatitis B, you should be screened for this virus. Talk with your health care provider to find out if you are at risk for hepatitis B infection. Hepatitis C Testing is recommended for:  Everyone born from 1945 through 1965.  Anyone with known risk factors for hepatitis C. Sexually transmitted infections (STIs)  Get screened for STIs, including gonorrhea and chlamydia, if: ? You are sexually active and are younger than 26 years of age. ? You are older than 26 years of age and your health care provider tells you that you are at risk for this type of infection. ? Your sexual activity has changed since you were last screened, and you are at increased risk for chlamydia or gonorrhea. Ask your health care provider if   you are at risk.  Ask your health care provider about whether you are at high risk for HIV. Your health care provider may recommend a prescription medicine to help prevent HIV infection. If you choose to take medicine to prevent HIV, you should first get tested for HIV. You should then be tested every 3 months for as long as you are taking the medicine. Pregnancy  If you are about to stop having your period (premenopausal) and  you may become pregnant, seek counseling before you get pregnant.  Take 400 to 800 micrograms (mcg) of folic acid every day if you become pregnant.  Ask for birth control (contraception) if you want to prevent pregnancy. Osteoporosis and menopause Osteoporosis is a disease in which the bones lose minerals and strength with aging. This can result in bone fractures. If you are 65 years old or older, or if you are at risk for osteoporosis and fractures, ask your health care provider if you should:  Be screened for bone loss.  Take a calcium or vitamin D supplement to lower your risk of fractures.  Be given hormone replacement therapy (HRT) to treat symptoms of menopause. Follow these instructions at home: Lifestyle  Do not use any products that contain nicotine or tobacco, such as cigarettes, e-cigarettes, and chewing tobacco. If you need help quitting, ask your health care provider.  Do not use street drugs.  Do not share needles.  Ask your health care provider for help if you need support or information about quitting drugs. Alcohol use  Do not drink alcohol if: ? Your health care provider tells you not to drink. ? You are pregnant, may be pregnant, or are planning to become pregnant.  If you drink alcohol: ? Limit how much you use to 0-1 drink a day. ? Limit intake if you are breastfeeding.  Be aware of how much alcohol is in your drink. In the [place] one drink equals one 12 oz bottle of beer (355 mL), one 5 oz glass of wine (148 mL), or one 1 oz glass of hard liquor (44 mL). General instructions  Schedule regular health, dental, and eye exams.  Stay current with your vaccines.  Tell your health care provider if: ? You often feel depressed. ? You have ever been abused or do not feel safe at home. Summary  Adopting a healthy lifestyle and getting preventive care are important in promoting health and wellness.  Follow your health care provider's instructions about healthy  diet, exercising, and getting tested or screened for diseases.  Follow your health care provider's instructions on monitoring your cholesterol and blood pressure. This information is not intended to replace advice given to you by your health care provider. Make sure you discuss any questions you have with your health care provider. Document Revised: 04/22/2018 Document Reviewed: 04/22/2018 Elsevier Patient Education  2020 Elsevier Inc.  
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
## 2019-08-25 LAB — CBC
Hematocrit: 39.3 % (ref 34.0–46.6)
Hemoglobin: 13.1 g/dL (ref 11.1–15.9)
MCH: 29.9 pg (ref 26.6–33.0)
MCHC: 33.3 g/dL (ref 31.5–35.7)
MCV: 90 fL (ref 79–97)
Platelets: 219 10*3/uL (ref 150–450)
RBC: 4.38 x10E6/uL (ref 3.77–5.28)
RDW: 12.7 % (ref 11.7–15.4)
WBC: 10.3 10*3/uL (ref 3.4–10.8)

## 2019-08-25 LAB — CERVICOVAGINAL ANCILLARY ONLY
Bacterial Vaginitis (gardnerella): NEGATIVE
Candida Glabrata: NEGATIVE
Candida Vaginitis: NEGATIVE
Chlamydia: NEGATIVE
Comment: NEGATIVE
Comment: NEGATIVE
Comment: NEGATIVE
Comment: NEGATIVE
Comment: NEGATIVE
Comment: NORMAL
Neisseria Gonorrhea: NEGATIVE
Trichomonas: NEGATIVE

## 2019-09-01 LAB — CYTOLOGY - PAP
Comment: NEGATIVE
Comment: NEGATIVE
Comment: NEGATIVE
HPV 16: NEGATIVE
HPV 18 / 45: NEGATIVE
High risk HPV: POSITIVE — AB

## 2019-09-02 ENCOUNTER — Other Ambulatory Visit: Payer: Self-pay | Admitting: Nurse Practitioner

## 2019-09-02 DIAGNOSIS — R87612 Low grade squamous intraepithelial lesion on cytologic smear of cervix (LGSIL): Secondary | ICD-10-CM

## 2019-09-30 ENCOUNTER — Ambulatory Visit: Payer: Self-pay

## 2019-10-14 ENCOUNTER — Other Ambulatory Visit: Payer: Self-pay

## 2019-10-14 ENCOUNTER — Ambulatory Visit: Payer: Self-pay | Admitting: *Deleted

## 2019-10-14 VITALS — BP 120/80 | Temp 98.2°F | Wt 191.8 lb

## 2019-10-14 DIAGNOSIS — R87612 Low grade squamous intraepithelial lesion on cytologic smear of cervix (LGSIL): Secondary | ICD-10-CM

## 2019-10-14 DIAGNOSIS — Z1239 Encounter for other screening for malignant neoplasm of breast: Secondary | ICD-10-CM

## 2019-10-14 NOTE — Progress Notes (Signed)
Holly [name] is a 24 y.o. female who presents to [facility] clinic today due to having an abnormal Pap smear 08/24/2019 that a colposcopy is recommended for follow-up.   Pap Smear: Pap not smear completed today. Last Pap smear was 08/24/2019 at [facility] and was LSIL with positive HPV. Patient has a history of an abnormal Pap smear 06/23/2018 that was ASCUS with positive HPV that her most recent Pap smear was her follow-up per patient. Last two Pap smear results are in Epic.   Physical exam: Breasts Breasts symmetrical. No skin abnormalities bilateral breasts. No nipple retraction bilateral breasts. No nipple discharge bilateral breasts. No lymphadenopathy. No lumps palpated bilateral breasts. No complaints of pain or tenderness on exam.       Pelvic/Bimanual Pap is not indicated today per BCCCP guidelines.    Smoking History: Patient has has never smoked.   Patient Navigation: Patient education provided. Access to services provided for patient through BCCCP program.    Breast and Cervical Cancer Risk Assessment: Patient has no family history of breast cancer, known genetic mutations, or radiation treatment to the chest before age 48. Patient has no history of cervical dysplasia, immunocompromised, or DES exposure in-utero. Breast cancer risk assessment completed. No breast cancer risk calculated due to patient is less than 26 years old.  A: BCCCP exam without pap smear  P: Referred patient to the Center for [facility] for a colposcopy to follow-up for her abnormal Pap smear. Appointment scheduled Friday, October 15, 2019 at [phone].  Priscille Heidelberg, RN 10/14/2019 11:20 AM

## 2019-10-14 NOTE — Patient Instructions (Signed)
Explained breast self awareness with Holly [name]. Patient did not need a Pap smear today due to last Pap smear was 08/24/2019. Explained the colposcopy the recommended follow-up for her abnormal Pap smear. Referred patient to the Center for [facility] for a colposcopy to follow-up for her abnormal Pap smear. Appointment scheduled Friday, October 15, 2019 at [phone]. Patient aware of appointment and will be there. Let patient know a screening mammogram is recommended at age 9 unless clinically indicated prior. Holly [name] verbalized understanding.  Gurjot Brisco, Kathaleen Maser, RN 11:21 AM

## 2019-10-15 ENCOUNTER — Ambulatory Visit (INDEPENDENT_AMBULATORY_CARE_PROVIDER_SITE_OTHER): Payer: Self-pay | Admitting: Family Medicine

## 2019-10-15 ENCOUNTER — Other Ambulatory Visit (HOSPITAL_COMMUNITY)
Admission: RE | Admit: 2019-10-15 | Discharge: 2019-10-15 | Disposition: A | Discharge status: Home / Self Care | Payer: Medicaid Other | Source: Ambulatory Visit | Attending: Family Medicine | Admitting: Family Medicine

## 2019-10-15 ENCOUNTER — Encounter: Payer: Self-pay | Admitting: Family Medicine

## 2019-10-15 VITALS — BP 111/70 | HR 66 | Wt 191.0 lb

## 2019-10-15 DIAGNOSIS — R87612 Low grade squamous intraepithelial lesion on cytologic smear of cervix (LGSIL): Secondary | ICD-10-CM | POA: Diagnosis not present

## 2019-10-15 DIAGNOSIS — Z3202 Encounter for pregnancy test, result negative: Secondary | ICD-10-CM

## 2019-10-15 LAB — POCT PREGNANCY, URINE: Preg Test, Ur: NEGATIVE

## 2019-10-15 NOTE — Progress Notes (Signed)
Patient Name: Holly [name], female   DOB: Sep 20, 1995, 24 y.o.  MRN: [id number]  Colposcopy Procedure Note:  No obstetric history on file. Pregnancy status: Unknown Indications: LSIL HPV:  Positive Cervical History:  Previous Abnormal Pap: 2020: ASCUS HPV  Previous Colposcopy: none  Previous LEEP or Cryo: none  Smoking: Never Smoked Hysterectomy: No   Patient given informed consent, signed copy in the chart, time out was performed.    Exam: Vulva and Vagina grossly normal.  Cervix viewed with speculum and colposcope after application of acetic acid:  Cervix Fully Visualized Squamocolumnar Junction Visibility: Fully visualized  Acetowhite lesions: none  Other Lesions: None Punctation: Not present  Mosaicism: Not present Abnormal vasculature: No   Biopsies: none ECC: Brush  Hemostasis achieved with:  n/a  Colposcopy Impression:  Benign   Patient was given post procedure instructions.  Will call patient with results.

## 2019-10-18 LAB — SURGICAL PATHOLOGY

## 2019-10-19 ENCOUNTER — Encounter (INDEPENDENT_AMBULATORY_CARE_PROVIDER_SITE_OTHER): Payer: Self-pay

## 2019-10-19 ENCOUNTER — Encounter: Payer: Self-pay | Admitting: Family Medicine

## 2019-10-19 DIAGNOSIS — R87612 Low grade squamous intraepithelial lesion on cytologic smear of cervix (LGSIL): Secondary | ICD-10-CM | POA: Insufficient documentation

## 2019-11-03 ENCOUNTER — Other Ambulatory Visit: Payer: Self-pay | Admitting: Nurse Practitioner

## 2019-11-03 DIAGNOSIS — F331 Major depressive disorder, recurrent, moderate: Secondary | ICD-10-CM

## 2019-11-03 MED ORDER — TOPIRAMATE 100 MG PO TABS: 100.0000 mg | ORAL_TABLET | Freq: Every day | ORAL | 1 refills | Status: DC

## 2019-11-03 MED ORDER — CITALOPRAM HYDROBROMIDE 40 MG PO TABS: 40.0000 mg | ORAL_TABLET | Freq: Every day | ORAL | 1 refills | Status: DC

## 2019-11-04 MED FILL — CITALOPRAM HBR 40 MG TABLET: 40 | 90 days supply | Qty: 90 | Fill #0

## 2019-11-04 MED FILL — TOPIRAMATE 100 MG TABS: 100 | 30 days supply | Qty: 30 | Fill #0

## 2020-01-14 ENCOUNTER — Encounter: Payer: Self-pay | Admitting: General Practice

## 2020-01-18 ENCOUNTER — Ambulatory Visit: Payer: Medicaid Other | Attending: Nurse Practitioner | Admitting: Nurse Practitioner

## 2020-01-18 ENCOUNTER — Other Ambulatory Visit: Payer: Self-pay

## 2020-01-18 VITALS — BP 102/71 | HR 85 | Temp 97.7°F | Wt 200.0 lb

## 2020-01-18 DIAGNOSIS — Z791 Long term (current) use of non-steroidal anti-inflammatories (NSAID): Secondary | ICD-10-CM | POA: Insufficient documentation

## 2020-01-18 DIAGNOSIS — M2550 Pain in unspecified joint: Secondary | ICD-10-CM | POA: Insufficient documentation

## 2020-01-18 DIAGNOSIS — F419 Anxiety disorder, unspecified: Secondary | ICD-10-CM | POA: Insufficient documentation

## 2020-01-18 DIAGNOSIS — L409 Psoriasis, unspecified: Secondary | ICD-10-CM | POA: Insufficient documentation

## 2020-01-18 DIAGNOSIS — F329 Major depressive disorder, single episode, unspecified: Secondary | ICD-10-CM

## 2020-01-18 DIAGNOSIS — J45909 Unspecified asthma, uncomplicated: Secondary | ICD-10-CM | POA: Insufficient documentation

## 2020-01-18 DIAGNOSIS — Z6841 Body Mass Index (BMI) 40.0 and over, adult: Secondary | ICD-10-CM | POA: Insufficient documentation

## 2020-01-18 DIAGNOSIS — Z1159 Encounter for screening for other viral diseases: Secondary | ICD-10-CM

## 2020-01-18 DIAGNOSIS — Z131 Encounter for screening for diabetes mellitus: Secondary | ICD-10-CM

## 2020-01-18 DIAGNOSIS — F32A Depression, unspecified: Secondary | ICD-10-CM

## 2020-01-18 DIAGNOSIS — Z79899 Other long term (current) drug therapy: Secondary | ICD-10-CM | POA: Insufficient documentation

## 2020-01-18 DIAGNOSIS — E669 Obesity, unspecified: Secondary | ICD-10-CM | POA: Insufficient documentation

## 2020-01-18 LAB — POCT GLYCOSYLATED HEMOGLOBIN (HGB A1C): Hemoglobin A1C: 5.3 % (ref 4.0–5.6)

## 2020-01-18 LAB — GLUCOSE, POCT (MANUAL RESULT ENTRY): POC Glucose: 99 mg/dl (ref 70–99)

## 2020-01-18 MED ORDER — BUPROPION HCL ER (XL) 150 MG PO TB24: 150.0000 mg | ORAL_TABLET | Freq: Every day | ORAL | 2 refills | Status: DC

## 2020-01-18 MED ORDER — HYDROXYZINE HCL 10 MG PO TABS: 10.0000 mg | ORAL_TABLET | Freq: Three times a day (TID) | ORAL | 0 refills | Status: AC | PRN

## 2020-01-18 MED FILL — hydrOXYzine HCL 10 MG TABS: 10 | 20 days supply | Qty: 60 | Fill #0

## 2020-01-18 MED FILL — BUPROPION HCL XL 150 MG TAB: 150 | 30 days supply | Qty: 30 | Fill #0
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
## 2020-01-20 ENCOUNTER — Encounter: Payer: Self-pay | Admitting: Nurse Practitioner

## 2020-01-20 LAB — BASIC METABOLIC PANEL
BUN/Creatinine Ratio: 33 — ABNORMAL HIGH (ref 9–23)
BUN: 17 mg/dL (ref 6–20)
CO2: 26 mmol/L (ref 20–29)
Calcium: 9.6 mg/dL (ref 8.7–10.2)
Chloride: 103 mmol/L (ref 96–106)
Creatinine, Ser: 0.51 mg/dL — ABNORMAL LOW (ref 0.57–1.00)
GFR calc Af Amer: 153 mL/min/{1.73_m2} (ref >59)
GFR calc non Af Amer: 133 mL/min/{1.73_m2} (ref >59)
Glucose: 88 mg/dL (ref 65–99)
Potassium: 4.2 mmol/L (ref 3.5–5.2)
Sodium: 140 mmol/L (ref 134–144)

## 2020-01-20 LAB — RHEUMATOID ARTHRITIS PROFILE
Cyclic Citrullin Peptide Ab: 7 units (ref 0–19)
Rheumatoid fact SerPl-aCnc: <10 IU/mL (ref 0.0–13.9)

## 2020-01-20 LAB — CBC
Hematocrit: 38 % (ref 34.0–46.6)
Hemoglobin: 12.6 g/dL (ref 11.1–15.9)
MCH: 29.4 pg (ref 26.6–33.0)
MCHC: 33.2 g/dL (ref 31.5–35.7)
MCV: 89 fL (ref 79–97)
Platelets: 234 10*3/uL (ref 150–450)
RBC: 4.29 x10E6/uL (ref 3.77–5.28)
RDW: 13.1 % (ref 11.7–15.4)
WBC: 10.4 10*3/uL (ref 3.4–10.8)

## 2020-01-20 LAB — HEPATITIS C ANTIBODY: Hep C Virus Ab: 0.6 s/co ratio (ref 0.0–0.9)

## 2020-02-21 ENCOUNTER — Ambulatory Visit: Payer: Self-pay | Attending: Nurse Practitioner | Admitting: Nurse Practitioner

## 2020-02-21 ENCOUNTER — Other Ambulatory Visit: Payer: Self-pay

## 2020-02-21 ENCOUNTER — Other Ambulatory Visit: Payer: Self-pay | Admitting: Nurse Practitioner

## 2020-02-21 ENCOUNTER — Encounter: Payer: Self-pay | Admitting: Nurse Practitioner

## 2020-02-21 DIAGNOSIS — F419 Anxiety disorder, unspecified: Secondary | ICD-10-CM

## 2020-02-21 DIAGNOSIS — F32A Depression, unspecified: Secondary | ICD-10-CM

## 2020-02-21 MED ORDER — DULOXETINE HCL 30 MG PO CPEP: 30.0000 mg | ORAL_CAPSULE | Freq: Every day | ORAL | 1 refills | Status: DC

## 2020-02-21 MED FILL — DULoxetine HCL 30 MG CPEP: 30 | 30 days supply | Qty: 30 | Fill #0
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
## 2020-03-14 ENCOUNTER — Encounter: Payer: Self-pay | Admitting: Nurse Practitioner

## 2020-03-14 ENCOUNTER — Other Ambulatory Visit: Payer: Self-pay

## 2020-03-14 ENCOUNTER — Ambulatory Visit: Payer: Self-pay | Attending: Nurse Practitioner | Admitting: Nurse Practitioner

## 2020-03-14 VITALS — BP 107/70 | HR 86 | Temp 97.7°F | Ht 58.5 in | Wt 200.0 lb

## 2020-03-14 DIAGNOSIS — F32A Depression, unspecified: Secondary | ICD-10-CM

## 2020-03-14 DIAGNOSIS — F419 Anxiety disorder, unspecified: Secondary | ICD-10-CM

## 2020-03-14 DIAGNOSIS — Z23 Encounter for immunization: Secondary | ICD-10-CM
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
## 2020-03-20 ENCOUNTER — Ambulatory Visit: Payer: Medicaid Other | Admitting: Pharmacist

## 2020-04-12 ENCOUNTER — Other Ambulatory Visit: Payer: Self-pay | Admitting: Nurse Practitioner

## 2020-04-12 ENCOUNTER — Ambulatory Visit: Payer: Self-pay | Attending: Nurse Practitioner | Admitting: Nurse Practitioner

## 2020-04-12 ENCOUNTER — Other Ambulatory Visit: Payer: Self-pay

## 2020-04-12 ENCOUNTER — Encounter: Payer: Self-pay | Admitting: Nurse Practitioner

## 2020-04-12 DIAGNOSIS — F32A Depression, unspecified: Secondary | ICD-10-CM

## 2020-04-12 DIAGNOSIS — F419 Anxiety disorder, unspecified: Secondary | ICD-10-CM

## 2020-04-12 DIAGNOSIS — J4520 Mild intermittent asthma, uncomplicated: Secondary | ICD-10-CM

## 2020-04-12 MED ORDER — ALBUTEROL SULFATE HFA 108 (90 BASE) MCG/ACT IN AERS: 2.0000 | INHALATION_SPRAY | Freq: Four times a day (QID) | RESPIRATORY_TRACT | 1 refills | Status: DC | PRN

## 2020-04-12 MED ORDER — DULOXETINE HCL 30 MG PO CPEP: 30.0000 mg | ORAL_CAPSULE | Freq: Every day | ORAL | 1 refills | Status: DC
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
## 2020-04-13 MED FILL — DULoxetine HCL 30 MG CPEP: 30 | 30 days supply | Qty: 30 | Fill #0

## 2020-04-13 MED FILL — VENTOLIN HFA 90 MCG INHALER: 108 (90 BAS | 25 days supply | Qty: 18 | Fill #0

## 2020-04-18 ENCOUNTER — Other Ambulatory Visit: Payer: Self-pay | Admitting: Nurse Practitioner

## 2020-04-18 ENCOUNTER — Encounter: Payer: Self-pay | Admitting: Nurse Practitioner

## 2020-04-18 DIAGNOSIS — F419 Anxiety disorder, unspecified: Secondary | ICD-10-CM

## 2020-04-18 DIAGNOSIS — F32A Depression, unspecified: Secondary | ICD-10-CM

## 2020-04-18 MED ORDER — DULOXETINE HCL 30 MG PO CPEP: 30.0000 mg | ORAL_CAPSULE | Freq: Every day | ORAL | 3 refills | Status: DC

## 2020-06-24 ENCOUNTER — Other Ambulatory Visit: Payer: Self-pay | Admitting: Nurse Practitioner

## 2020-06-24 DIAGNOSIS — F32A Depression, unspecified: Secondary | ICD-10-CM

## 2020-06-24 DIAGNOSIS — F419 Anxiety disorder, unspecified: Secondary | ICD-10-CM

## 2020-06-27 NOTE — Telephone Encounter (Signed)
Will forward to provider  

## 2020-06-30 ENCOUNTER — Other Ambulatory Visit: Payer: Self-pay | Admitting: Nurse Practitioner

## 2020-06-30 MED ORDER — DULOXETINE HCL 30 MG PO CPEP: 30.0000 mg | ORAL_CAPSULE | Freq: Every day | ORAL | 3 refills | Status: DC

## 2020-06-30 MED FILL — DULoxetine HCL 30 MG CPEP: 30 | 30 days supply | Qty: 30 | Fill #0

## 2020-07-11 ENCOUNTER — Other Ambulatory Visit: Payer: Self-pay

## 2020-07-11 ENCOUNTER — Ambulatory Visit: Payer: Self-pay | Attending: Nurse Practitioner | Admitting: Nurse Practitioner

## 2020-07-11 ENCOUNTER — Encounter: Payer: Self-pay | Admitting: Nurse Practitioner

## 2020-07-11 DIAGNOSIS — F32A Depression, unspecified: Secondary | ICD-10-CM

## 2020-07-11 DIAGNOSIS — F419 Anxiety disorder, unspecified: Secondary | ICD-10-CM
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
## 2020-08-19 MED FILL — Duloxetine HCl Enteric Coated Pellets Cap 30 MG (Base Eq): ORAL | 30 days supply | Qty: 30 | Fill #0 | Status: AC

## 2020-08-21 ENCOUNTER — Other Ambulatory Visit: Payer: Self-pay

## 2020-08-22 ENCOUNTER — Other Ambulatory Visit: Payer: Self-pay

## 2020-08-28 ENCOUNTER — Other Ambulatory Visit: Payer: Self-pay

## 2020-08-28 ENCOUNTER — Ambulatory Visit: Payer: Self-pay | Attending: Nurse Practitioner | Admitting: Nurse Practitioner

## 2020-08-28 ENCOUNTER — Encounter: Payer: Self-pay | Admitting: Nurse Practitioner

## 2020-08-28 VITALS — BP 117/77 | HR 76 | Resp 18 | Ht <= 58 in | Wt 198.8 lb

## 2020-08-28 DIAGNOSIS — Z79899 Other long term (current) drug therapy: Secondary | ICD-10-CM | POA: Insufficient documentation

## 2020-08-28 DIAGNOSIS — F32A Depression, unspecified: Secondary | ICD-10-CM | POA: Insufficient documentation

## 2020-08-28 DIAGNOSIS — Z124 Encounter for screening for malignant neoplasm of cervix: Secondary | ICD-10-CM | POA: Insufficient documentation

## 2020-08-28 DIAGNOSIS — Z1151 Encounter for screening for human papillomavirus (HPV): Secondary | ICD-10-CM | POA: Insufficient documentation

## 2020-08-28 DIAGNOSIS — F419 Anxiety disorder, unspecified: Secondary | ICD-10-CM | POA: Insufficient documentation

## 2020-08-28 DIAGNOSIS — R7989 Other specified abnormal findings of blood chemistry: Secondary | ICD-10-CM | POA: Insufficient documentation

## 2020-08-28 MED ORDER — DULOXETINE HCL 30 MG PO CPEP
ORAL_CAPSULE | Freq: Every day | ORAL | 3 refills | Status: DC
  Filled 2020-08-28: qty 90, fill #0
  Filled 2020-10-18: qty 30, 30d supply, fill #0
  Filled 2020-11-17: qty 30, 30d supply, fill #1
  Filled 2021-01-04: qty 30, 30d supply, fill #2
  Filled 2021-02-15: qty 30, 30d supply, fill #3
  Filled 2021-03-26: qty 30, 30d supply, fill #4
  Filled 2021-04-25: qty 30, 30d supply, fill #5
  Filled 2021-05-25 – 2021-06-07 (×4): qty 30, 30d supply, fill #0
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
## 2020-08-29 LAB — CBC
Hematocrit: 38.8 % (ref 34.0–46.6)
Hemoglobin: 12.6 g/dL (ref 11.1–15.9)
MCH: 27.9 pg (ref 26.6–33.0)
MCHC: 32.5 g/dL (ref 31.5–35.7)
MCV: 86 fL (ref 79–97)
Platelets: 202 10*3/uL (ref 150–450)
RBC: 4.51 x10E6/uL (ref 3.77–5.28)
RDW: 12.8 % (ref 11.7–15.4)
WBC: 9.8 10*3/uL (ref 3.4–10.8)

## 2020-08-29 LAB — CERVICOVAGINAL ANCILLARY ONLY
Bacterial Vaginitis (gardnerella): NEGATIVE
Candida Glabrata: NEGATIVE
Candida Vaginitis: NEGATIVE
Chlamydia: NEGATIVE
Comment: NEGATIVE
Comment: NEGATIVE
Comment: NEGATIVE
Comment: NEGATIVE
Comment: NEGATIVE
Comment: NORMAL
Neisseria Gonorrhea: NEGATIVE
Trichomonas: NEGATIVE

## 2020-08-30 LAB — CYTOLOGY - PAP
Comment: NEGATIVE
High risk HPV: POSITIVE — AB

## 2020-08-31 ENCOUNTER — Other Ambulatory Visit: Payer: Self-pay | Admitting: Nurse Practitioner

## 2020-08-31 DIAGNOSIS — R87612 Low grade squamous intraepithelial lesion on cytologic smear of cervix (LGSIL): Secondary | ICD-10-CM

## 2020-09-27 ENCOUNTER — Telehealth: Payer: Self-pay

## 2020-09-27 NOTE — Telephone Encounter (Signed)
Telephoned patient at mobile number. Left a voice message with [facility] contact information. 

## 2020-10-18 ENCOUNTER — Encounter: Payer: Self-pay | Admitting: Nurse Practitioner

## 2020-10-19 ENCOUNTER — Other Ambulatory Visit: Payer: Self-pay

## 2020-10-19 ENCOUNTER — Other Ambulatory Visit: Payer: Self-pay | Admitting: Nurse Practitioner

## 2020-10-19 DIAGNOSIS — L409 Psoriasis, unspecified: Secondary | ICD-10-CM

## 2020-10-19 MED ORDER — BETAMETHASONE VALERATE 0.1 % EX CREA
TOPICAL_CREAM | Freq: Two times a day (BID) | CUTANEOUS | 1 refills | Status: DC
  Filled 2020-10-19: qty 45, 22d supply, fill #0
  Filled 2020-11-10: qty 90, 30d supply, fill #0
  Filled 2020-11-21: qty 90, 20d supply, fill #0
  Filled 2021-06-07: qty 90, 30d supply, fill #0
  Filled 2021-06-07: qty 90, 20d supply, fill #0

## 2020-10-20 ENCOUNTER — Other Ambulatory Visit: Payer: Self-pay

## 2020-10-23 ENCOUNTER — Other Ambulatory Visit: Payer: Self-pay

## 2020-10-25 ENCOUNTER — Other Ambulatory Visit: Payer: Self-pay | Admitting: Obstetrics and Gynecology

## 2020-10-30 ENCOUNTER — Other Ambulatory Visit: Payer: Self-pay

## 2020-11-08 ENCOUNTER — Ambulatory Visit: Payer: Medicaid Other

## 2020-11-10 ENCOUNTER — Other Ambulatory Visit: Payer: Self-pay

## 2020-11-14 ENCOUNTER — Other Ambulatory Visit: Payer: Self-pay

## 2020-11-17 ENCOUNTER — Other Ambulatory Visit: Payer: Self-pay

## 2020-11-17 MED FILL — Albuterol Sulfate Inhal Aero 108 MCG/ACT (90MCG Base Equiv): RESPIRATORY_TRACT | 25 days supply | Qty: 18 | Fill #0 | Status: AC

## 2020-11-21 ENCOUNTER — Other Ambulatory Visit: Payer: Self-pay

## 2020-12-20 ENCOUNTER — Ambulatory Visit: Payer: Medicaid Other | Admitting: Family Medicine

## 2020-12-21 ENCOUNTER — Other Ambulatory Visit: Payer: Self-pay

## 2020-12-21 ENCOUNTER — Ambulatory Visit: Payer: Medicaid Other | Admitting: Obstetrics & Gynecology

## 2020-12-21 ENCOUNTER — Encounter: Payer: Self-pay | Admitting: Obstetrics & Gynecology

## 2020-12-21 ENCOUNTER — Ambulatory Visit: Payer: Self-pay | Admitting: *Deleted

## 2020-12-21 ENCOUNTER — Other Ambulatory Visit (HOSPITAL_COMMUNITY)
Admission: RE | Admit: 2020-12-21 | Discharge: 2020-12-21 | Disposition: A | Discharge status: Home / Self Care | Payer: Medicaid Other | Source: Ambulatory Visit | Attending: Family Medicine | Admitting: Family Medicine

## 2020-12-21 ENCOUNTER — Ambulatory Visit (INDEPENDENT_AMBULATORY_CARE_PROVIDER_SITE_OTHER): Payer: Self-pay | Admitting: Obstetrics & Gynecology

## 2020-12-21 VITALS — BP 118/74 | Wt 197.5 lb

## 2020-12-21 VITALS — BP 129/75 | HR 64 | Wt 196.9 lb

## 2020-12-21 DIAGNOSIS — R87810 Cervical high risk human papillomavirus (HPV) DNA test positive: Secondary | ICD-10-CM

## 2020-12-21 DIAGNOSIS — R87612 Low grade squamous intraepithelial lesion on cytologic smear of cervix (LGSIL): Secondary | ICD-10-CM | POA: Diagnosis not present

## 2020-12-21 DIAGNOSIS — Z1239 Encounter for other screening for malignant neoplasm of breast: Secondary | ICD-10-CM

## 2020-12-21 LAB — POCT PREGNANCY, URINE: Preg Test, Ur: NEGATIVE

## 2020-12-21 NOTE — Progress Notes (Signed)
Ms. Beau Vanduzer is a 25 y.o. female who presents to [facility] clinic today with no complaints. Patient referred to [facility] by [facility] due to having an abnormal Pap smear that a colposcopy is recommended for follow-up. Last Pap smear was 08/28/2020.   Pap Smear: Pap smear not completed today. Last Pap smear was 08/28/2020 at [facility] and was abnormal - LSIL with positive HPV . Patient has history of two other abnormal Pap smears 08/24/2019 that was LSIL with positive HPV that a colposcopy was completed for follow-up that was benign and 06/23/2018 that was ASCUS with positive HPV that no follow-up was completed. Last Pap smear result is available in Epic.   Physical exam: Breasts Breasts symmetrical. No skin abnormalities bilateral breasts. No nipple retraction bilateral breasts. No nipple discharge bilateral breasts. No lymphadenopathy. No lumps palpated bilateral breasts. No complaints of pain or tenderness on exam. Screening mammogram recommended at age 79 unless clinically indicated prior.      Pelvic/Bimanual Pap is not indicated today per [facility] guidelines.   Smoking History: Patient has never smoked.   Patient Navigation: Patient education provided. Access to services provided for patient through [facility] program.    Breast and Cervical Cancer Risk Assessment: Patient does not have family history of breast cancer, known genetic mutations, or radiation treatment to the chest before age 65. Patient does not have history of cervical dysplasia, immunocompromised, or DES exposure in-utero. Breast cancer risk assessment completed. No breast cancer risk calculated due to patient is less than 83 years old.  Risk Assessment     Risk Scores       12/21/2020 10/14/2019   Last edited by: Narda Rutherford, LPN [place], Carlye Grippe, RN   5-year risk:     Lifetime risk:              A: [facility] exam without pap smear No  complaints.  P: Referred patient to the [facility] for a colposcopy to follow-up for her abnormal Pap smear. Appointment scheduled Thursday, December 21, 2020 at 0935.  Priscille Heidelberg, RN 12/21/2020 8:49 AM

## 2020-12-21 NOTE — Patient Instructions (Signed)
Explained breast self awareness with Holly [name]. Patient did not need a Pap smear today due to last Pap smear was 08/28/2020. Explained the colposcopy the recommended follow-up for her abnormal Pap smear. Referred patient to the [facility] for a colposcopy to follow-up for her abnormal Pap smear. Appointment scheduled Thursday, December 21, 2020 at 0935. Patient aware of appointment and will be there. Let patient know a screening mammogram is recommended at age 25 unless clinically indicated prior. Holly [name] verbalized understanding.  Holly [name], Holly Maser, RN 8:49 AM

## 2020-12-21 NOTE — Addendum Note (Signed)
Addended byVidal Schwalbe on: 12/21/2020 02:33 PM   Modules accepted: Orders
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
## 2020-12-25 LAB — SURGICAL PATHOLOGY

## 2021-01-04 ENCOUNTER — Other Ambulatory Visit: Payer: Self-pay

## 2021-01-08 ENCOUNTER — Telehealth: Payer: Self-pay

## 2021-01-08 NOTE — Telephone Encounter (Signed)
-----   Message from Adam Phenix, MD sent at 01/08/2021 10:33 AM EDT ----- Repeat pap for CIN 1 in 12 months

## 2021-01-08 NOTE — Telephone Encounter (Signed)
Call placed to pt. Spoke with pt. Pt given results and recommendations per Dr Debroah Loop. Pt verbalized understanding and agreeable to plan of care.   Judeth Cornfield, RN

## 2021-01-08 NOTE — Progress Notes (Signed)
Repeat pap for CIN 1 in 12 months

## 2021-02-15 ENCOUNTER — Other Ambulatory Visit: Payer: Self-pay

## 2021-03-26 ENCOUNTER — Other Ambulatory Visit: Payer: Self-pay

## 2021-04-26 ENCOUNTER — Other Ambulatory Visit: Payer: Self-pay

## 2021-05-25 ENCOUNTER — Other Ambulatory Visit (HOSPITAL_BASED_OUTPATIENT_CLINIC_OR_DEPARTMENT_OTHER): Payer: Self-pay

## 2021-05-30 ENCOUNTER — Other Ambulatory Visit (HOSPITAL_BASED_OUTPATIENT_CLINIC_OR_DEPARTMENT_OTHER): Payer: Self-pay

## 2021-06-07 ENCOUNTER — Other Ambulatory Visit: Payer: Self-pay | Admitting: Nurse Practitioner

## 2021-06-07 ENCOUNTER — Other Ambulatory Visit (HOSPITAL_COMMUNITY): Payer: Self-pay

## 2021-06-07 ENCOUNTER — Other Ambulatory Visit: Payer: Self-pay

## 2021-06-07 DIAGNOSIS — J4520 Mild intermittent asthma, uncomplicated: Secondary | ICD-10-CM

## 2021-06-07 NOTE — Telephone Encounter (Signed)
Requested medications are due for refill today.  yes  Requested medications are on the active medications list.  yes  Last refill. 04/12/2020  Future visit scheduled.   no  Notes to clinic.  Rx expired 04/12/2021.    Requested Prescriptions  Pending Prescriptions Disp Refills   albuterol (VENTOLIN HFA) 108 (90 Base) MCG/ACT inhaler 18 g 1    Sig: INHALE 2 PUFFS INTO THE LUNGS EVERY 6 (SIX) HOURS AS NEEDED FOR WHEEZING OR SHORTNESS OF BREATH.     Pulmonology:  Beta Agonists Failed - 06/07/2021  2:46 PM      Failed - One inhaler should last at least one month. If the patient is requesting refills earlier, contact the patient to check for uncontrolled symptoms.      Passed - Valid encounter within last 12 months    Recent Outpatient Visits           9 months ago Encounter for Papanicolaou smear for cervical cancer screening   [facility] [place], Shea Stakes, NP   11 months ago Anxiety and depression   [facility] [place], Shea Stakes, NP   1 year ago Anxiety and depression   [facility] [place], Shea Stakes, NP   1 year ago Anxiety and depression   [facility] [place], Shea Stakes, NP   1 year ago Anxiety and depression   [facility] [place], Shea Stakes, NP

## 2021-06-08 ENCOUNTER — Other Ambulatory Visit: Payer: Self-pay

## 2021-06-11 ENCOUNTER — Other Ambulatory Visit: Payer: Self-pay

## 2021-06-12 ENCOUNTER — Other Ambulatory Visit: Payer: Self-pay

## 2021-10-10 ENCOUNTER — Telehealth: Payer: Self-pay

## 2021-10-10 NOTE — Telephone Encounter (Signed)
New patient appointment has been scheduled.

## 2021-11-19 ENCOUNTER — Other Ambulatory Visit: Payer: Self-pay | Admitting: Nurse Practitioner

## 2021-11-19 DIAGNOSIS — L409 Psoriasis, unspecified: Secondary | ICD-10-CM

## 2021-11-20 NOTE — Telephone Encounter (Signed)
Requested medication (s) are due for refill today: yes  Requested medication (s) are on the active medication list: yes  Last refill:  10/19/21 100 g with 1 RF  Future visit scheduled: no, seen 08/28/21  Notes to clinic:  This med does not have a protocol to follow, please assess.      Requested Prescriptions  Pending Prescriptions Disp Refills   betamethasone valerate (VALISONE) 0.1 % cream 100 g 1    Sig: Apply topically 2 (two) times daily.     Off-Protocol Failed - 11/19/2021 10:30 AM      Failed - Medication not assigned to a protocol, review manually.      Failed - Valid encounter within last 12 months    Recent Outpatient Visits           1 year ago Encounter for Papanicolaou smear for cervical cancer screening   [place] [facility] [place], Shea Stakes, NP   1 year ago Anxiety and depression   [facility] [place], Shea Stakes, NP   1 year ago Anxiety and depression   [facility] [place], Shea Stakes, NP   1 year ago Anxiety and depression    [facility] [place], Shea Stakes, NP   1 year ago Anxiety and depression   [facility] [place], Shea Stakes, NP

## 2021-11-21 ENCOUNTER — Ambulatory Visit: Payer: Medicaid Other | Admitting: Physician Assistant

## 2021-11-21 ENCOUNTER — Other Ambulatory Visit (HOSPITAL_COMMUNITY): Payer: Self-pay

## 2021-12-26 ENCOUNTER — Ambulatory Visit: Payer: Medicaid Other | Attending: Nurse Practitioner | Admitting: Nurse Practitioner

## 2021-12-26 ENCOUNTER — Other Ambulatory Visit: Payer: Self-pay

## 2021-12-26 ENCOUNTER — Encounter: Payer: Self-pay | Admitting: Nurse Practitioner

## 2021-12-26 VITALS — BP 122/83 | HR 82 | Temp 98.0°F | Ht <= 58 in | Wt 201.8 lb

## 2021-12-26 DIAGNOSIS — R7309 Other abnormal glucose: Secondary | ICD-10-CM

## 2021-12-26 DIAGNOSIS — L409 Psoriasis, unspecified: Secondary | ICD-10-CM | POA: Insufficient documentation

## 2021-12-26 DIAGNOSIS — Z79899 Other long term (current) drug therapy: Secondary | ICD-10-CM | POA: Insufficient documentation

## 2021-12-26 DIAGNOSIS — R7989 Other specified abnormal findings of blood chemistry: Secondary | ICD-10-CM

## 2021-12-26 DIAGNOSIS — R739 Hyperglycemia, unspecified: Secondary | ICD-10-CM | POA: Insufficient documentation

## 2021-12-26 MED ORDER — BETAMETHASONE VALERATE 0.1 % EX CREA
TOPICAL_CREAM | Freq: Two times a day (BID) | CUTANEOUS | 6 refills | Status: DC
  Filled 2021-12-26: qty 45, 25d supply, fill #0
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
## 2021-12-26 NOTE — Progress Notes (Signed)
Discuss up coming pap.Screen for diabetes and cancer.

## 2021-12-27 ENCOUNTER — Other Ambulatory Visit: Payer: Self-pay

## 2021-12-27 LAB — CBC WITH DIFFERENTIAL/PLATELET
Basophils Absolute: 0.1 10*3/uL (ref 0.0–0.2)
Basos: 1 %
EOS (ABSOLUTE): 0.1 10*3/uL (ref 0.0–0.4)
Eos: 2 %
Hematocrit: 38.2 % (ref 34.0–46.6)
Hemoglobin: 13 g/dL (ref 11.1–15.9)
Immature Grans (Abs): 0 10*3/uL (ref 0.0–0.1)
Immature Granulocytes: 0 %
Lymphocytes Absolute: 3 10*3/uL (ref 0.7–3.1)
Lymphs: 35 %
MCH: 28.9 pg (ref 26.6–33.0)
MCHC: 34 g/dL (ref 31.5–35.7)
MCV: 85 fL (ref 79–97)
Monocytes Absolute: 0.6 10*3/uL (ref 0.1–0.9)
Monocytes: 7 %
Neutrophils Absolute: 4.9 10*3/uL (ref 1.4–7.0)
Neutrophils: 55 %
Platelets: 211 10*3/uL (ref 150–450)
RBC: 4.5 x10E6/uL (ref 3.77–5.28)
RDW: 12.9 % (ref 11.7–15.4)
WBC: 8.8 10*3/uL (ref 3.4–10.8)

## 2021-12-27 LAB — HEMOGLOBIN A1C
Est. average glucose Bld gHb Est-mCnc: 114 mg/dL
Hgb A1c MFr Bld: 5.6 % (ref 4.8–5.6)

## 2021-12-28 ENCOUNTER — Other Ambulatory Visit: Payer: Self-pay

## 2021-12-31 ENCOUNTER — Other Ambulatory Visit: Payer: Self-pay

## 2022-01-05 ENCOUNTER — Encounter: Payer: Self-pay | Admitting: Nurse Practitioner

## 2022-01-05 DIAGNOSIS — L237 Allergic contact dermatitis due to plants, except food: Secondary | ICD-10-CM

## 2022-01-05 MED ORDER — PREDNISONE 10 MG PO TABS: ORAL_TABLET | ORAL | 0 refills | Status: DC

## 2022-01-05 NOTE — Progress Notes (Deleted)
E-Visit for [employer]  We are sorry that you are not feeing well.  Here is how we plan to help!  Based on what you have shared with me it looks like you have had an allergic reaction to the oily resin from a group of plants.  This resin is very sticky, so it easily attaches to your skin, clothing, tools equipment, and pet's fur.    This blistering rash is often called poison ivy rash although it can come from contact with the leaves, stems and roots of poison ivy, poison oak and poison sumac.  The oily resin contains urushiol (u-ROO-she-ol) that produces a skin rash on exposed skin.  The severity of the rash depends on the amount of urushiol that gets on your skin.  A section of skin with more urushiol on it may develop a rash sooner.  The rash usually develops 12-48 hours after exposure and can last two to three weeks.  Your skin must come in direct contact with the plant's oil to be affected.  Blister fluid doesn't spread the rash.  However, if you come into contact with a piece of clothing or pet fur that has urushiol on it, the rash may spread out.  You can also transfer the oil to other parts of your body with your fingers.  Often the rash looks like a straight line because of the way the plant brushes against your skin.  Since your rash is widespread or has resulted in a large number of blisters, I have prescribed an oral corticosteroid.  Please follow these recommendations:  I have sent a prednisone dose pack to your chosen pharmacy. Be sure to follow the instructions carefully and complete the entire prescription. You may use Benadryl or Caladryl topical lotions to sooth the itch and remember cool, not hot, showers and baths can help relieve the itching!  Place cool, wet compresses on the affected area for 15-30 minutes several times a day.  You may also take oral antihistamines, such as diphenhydramine (Benadryl, others), which may also help you sleep better.  Watch your skin for any purulent  (pus) drainage or red streaking from the site.  If this occurs, contact your provider.  You may require an antibiotic for a skin infection.  Make sure that the clothes you were wearing as well as any towels or sheets that may have come in contact with the oil (urushiol) are washed in detergent and hot water.       I have developed the following plan to treat your condition {[email or web address]:[phone]}   What can you do to prevent this rash?  Avoid the plants.  Learn how to identify poison ivy, poison oak and poison sumac in all seasons.  When hiking or engaging in other activities that might expose you to these plants, try to stay on cleared pathways.  If camping, make sure you pitch your tent in an area free of these plants.  Keep pets from running through wooded areas so that urushiol doesn't accidentally stick to their fur, which you may touch.  Remove or kill the plants.  In your yard, you can get rid of poison ivy by applying an herbicide or pulling it out of the ground, including the roots, while wearing heavy gloves.  Afterward remove the gloves and thoroughly wash them and your hands.  Don't burn poison ivy or related plants because the urushiol can be carried by smoke.  Wear protective clothing.  If needed, protect your skin by wearing  socks, boots, pants, long sleeves and vinyl gloves.  Wash your skin right away.  Washing off the oil with soap and water within 30 minutes of exposure may reduce your chances of getting a poison ivy rash.  Even washing after an hour or so can help reduce the severity of the rash.  If you walk through some poison ivy and then later touch your shoes, you may get some urushiol on your hands, which may then transfer to your face or body by touching or rubbing.  If the contaminated object isn't cleaned, the urushiol on it can still cause a skin reaction years later.    Be careful not to reuse towels after you have washed your skin.  Also carefully wash  clothing in detergent and hot water to remove all traces of the oil.  Handle contaminated clothing carefully so you don't transfer the urushiol to yourself, furniture, rugs or appliances.  Remember that pets can carry the oil on their fur and paws.  If you think your pet may be contaminated with urushiol, put on some long rubber gloves and give your pet a bath.  Finally, be careful not to burn these plants as the smoke can contain traces of the oil.  Inhaling the smoke may result in difficulty breathing. If that occurred you should see a physician as soon as possible.  See your doctor right away if:  The reaction is severe or widespread You inhaled the smoke from burning poison ivy and are having difficulty breathing Your skin continues to swell The rash affects your eyes, mouth or genitals Blisters are oozing pus You develop a fever greater than 100 F (37.8 C) The rash doesn't get better within a few weeks.  If you scratch the poison ivy rash, bacteria under your fingernails may cause the skin to become infected.  See your doctor if pus starts oozing from the blisters.  Treatment generally includes antibiotics.  Poison ivy treatments are usually limited to self-care methods.  And the rash typically goes away on its own in two to three weeks.     If the rash is widespread or results in a large number of blisters, your doctor may prescribe an oral corticosteroid, such as prednisone.  If a bacterial infection has developed at the rash site, your doctor may give you a prescription for an oral antibiotic.  MAKE SURE YOU  Understand these instructions. Will watch your condition. Will get help right away if you are not doing well or get worse.   Thank you for choosing an e-visit.  Your e-visit answers were reviewed by a board certified advanced clinical practitioner to complete your personal care plan. Depending upon the condition, your plan could have included both over the counter or  prescription medications.  Please review your pharmacy choice. Make sure the pharmacy is open so you can pick up prescription now. If there is a problem, you may contact your provider through [employer] and have the prescription routed to another pharmacy.  Your safety is important to [place]. If you have drug allergies check your prescription carefully.   For the next 24 hours you can use MyChart to ask questions about today's visit, request a non-urgent call back, or ask for a work or school excuse. You will get an email in the next two days asking about your experience. I hope that your e-visit has been valuable and will speed your recovery.

## 2022-01-05 NOTE — Progress Notes (Signed)
ERROR

## 2022-02-19 ENCOUNTER — Ambulatory Visit: Payer: Self-pay | Admitting: *Deleted

## 2022-02-19 VITALS — BP 122/86 | Wt 201.8 lb

## 2022-02-19 DIAGNOSIS — N898 Other specified noninflammatory disorders of vagina: Secondary | ICD-10-CM

## 2022-02-19 DIAGNOSIS — Z01419 Encounter for gynecological examination (general) (routine) without abnormal findings: Secondary | ICD-10-CM

## 2022-02-19 NOTE — Progress Notes (Signed)
Holly [name] is a 26 y.o. [place] female who presents to [facility] clinic today with no complaints.    Pap Smear: Pap smear completed today. 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 Patient has history of two other abnormal Pap smears 08/24/2019 that was LSIL with positive HPV that a colposcopy was completed for follow-up that was benign and 06/23/2018 that was ASCUS with positive HPV that no follow-up was completed. Last Pap smear result is available in Epic.   Physical exam: Breasts Breasts symmetrical. No skin abnormalities bilateral breasts. No nipple retraction bilateral breasts. No nipple discharge bilateral breasts. No lymphadenopathy. No lumps palpated bilateral breasts. No complaints of pain or tenderness on exam. Screening mammogram recommended at age 48 unless clinically indicated prior.     Pelvic/Bimanual Ext Genitalia No lesions, no swelling and no discharge observed on external genitalia.        Vagina Vagina pink and normal texture. No lesions and thick white yeast appearing discharge observed in vagina. Wet prep completed.       Cervix Cervix is present. Cervix pink and of normal texture. Thick white yeast appearing discharge observed on cervix.   Uterus Uterus is present and palpable. Uterus in normal position and normal size.        Adnexae Bilateral ovaries present and palpable. No tenderness on palpation.         Rectovaginal No rectal exam completed today since patient had no rectal complaints. No skin abnormalities observed on exam.     Smoking History: Patient has never smoked.   Patient Navigation: Patient education provided. Access to services provided for patient through BCCCP program.   Breast and Cervical Cancer Risk Assessment: Patient does not have family history of breast cancer, known genetic mutations,  or radiation treatment to the chest before age 34. Patient has history of cervical dysplasia. Patient has no history of being immunocompromised or DES exposure in-utero. Breast cancer risk assessment completed. No breast cancer risk calculated due to patient is less than 25 years old.    Risk Assessment     Risk Scores       02/19/2022 12/21/2020   Last edited by: Royston Bake, CMA [place], Karlton Lemon, LPN   5-year risk:     Lifetime risk:              A: BCCCP exam with pap smear No complaints.   Holly Parish, RN 02/19/2022 11:14 AM

## 2022-02-19 NOTE — Patient Instructions (Signed)
Explained breast self awareness with [name]. Pap smear completed today. Let her know that if today's Pap smear is normal and HPV negative that her next Pap smear is due in one year due to her history of an abnormal Pap smear. Let patient know will follow up with her within the next week with results of wet prep and Pap smear by phone. Holly [name] verbalized understanding.  [name], Arvil Chaco, RN 11:15 AM

## 2022-02-20 LAB — CERVICOVAGINAL ANCILLARY ONLY
Bacterial Vaginitis (gardnerella): NEGATIVE
Candida Glabrata: NEGATIVE
Candida Vaginitis: NEGATIVE
Comment: NEGATIVE
Comment: NEGATIVE
Comment: NEGATIVE
Comment: NEGATIVE
Trichomonas: NEGATIVE

## 2022-02-26 LAB — CYTOLOGY - PAP
Comment: NEGATIVE
High risk HPV: POSITIVE — AB

## 2022-03-11 ENCOUNTER — Telehealth: Payer: Self-pay

## 2022-03-11 NOTE — Telephone Encounter (Signed)
Spoke with patient about pap results. Informed patient that pap showed (LSIL, + HPV). Follow-up recommendation will be for a repeat colpo. Will add patient to scheduling list to have colpo done with [place] [facility]. Informed patient that wet prep was normal. Patient voiced understanding.

## 2022-04-23 ENCOUNTER — Ambulatory Visit: Payer: Medicaid Other | Admitting: Hematology and Oncology

## 2022-04-23 ENCOUNTER — Other Ambulatory Visit (HOSPITAL_COMMUNITY)
Admission: RE | Admit: 2022-04-23 | Discharge: 2022-04-23 | Disposition: A | Discharge status: Home / Self Care | Payer: Medicaid Other | Source: Ambulatory Visit | Attending: Obstetrics and Gynecology | Admitting: Obstetrics and Gynecology

## 2022-04-23 VITALS — BP 120/82 | Wt 199.0 lb

## 2022-04-23 DIAGNOSIS — R87612 Low grade squamous intraepithelial lesion on cytologic smear of cervix (LGSIL): Secondary | ICD-10-CM | POA: Insufficient documentation

## 2022-04-23 DIAGNOSIS — N870 Mild cervical dysplasia: Secondary | ICD-10-CM | POA: Diagnosis not present

## 2022-04-23 DIAGNOSIS — Z01812 Encounter for preprocedural laboratory examination: Secondary | ICD-10-CM

## 2022-04-23 NOTE — Addendum Note (Signed)
Addended by: Lucilla Lame E on: 04/23/2022 01:45 PM   Modules accepted: Orders
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
## 2022-04-25 LAB — SURGICAL PATHOLOGY

## 2022-04-29 ENCOUNTER — Other Ambulatory Visit: Payer: Self-pay | Admitting: Hematology and Oncology

## 2022-04-29 NOTE — Progress Notes (Signed)
Attempted to notify patient regarding colposcopy results. No answer, left message to return call. Patient will need repeat pap smear next year due to results LSIL.

## 2022-05-02 ENCOUNTER — Other Ambulatory Visit: Payer: Self-pay

## 2022-05-26 ENCOUNTER — Other Ambulatory Visit: Payer: Self-pay | Admitting: Nurse Practitioner

## 2022-05-26 DIAGNOSIS — F909 Attention-deficit hyperactivity disorder, unspecified type: Secondary | ICD-10-CM

## 2022-05-29 ENCOUNTER — Telehealth: Payer: Self-pay

## 2022-05-29 NOTE — Telephone Encounter (Signed)
Attempted to contact patient to give colpo results. Left name and number for patient to call back.

## 2022-06-13 ENCOUNTER — Telehealth: Payer: Self-pay | Admitting: Nurse Practitioner

## 2022-06-13 NOTE — Telephone Encounter (Signed)
Copied from [place] [phone]. Topic: Referral - Status >> Jun 13, 2022  8:31 AM Sabas Sous wrote: Reason for CRM: Pt called requesting the contact information for her behavioral health referral.   Best contact:

## 2022-06-26 ENCOUNTER — Ambulatory Visit: Payer: Medicaid Other | Admitting: Nurse Practitioner

## 2022-07-11 ENCOUNTER — Ambulatory Visit: Payer: Medicaid Other | Admitting: Family Medicine

## 2022-07-22 ENCOUNTER — Encounter: Payer: Self-pay | Admitting: Obstetrics and Gynecology

## 2022-07-22 ENCOUNTER — Ambulatory Visit (INDEPENDENT_AMBULATORY_CARE_PROVIDER_SITE_OTHER): Payer: Self-pay | Admitting: Clinical

## 2022-07-22 ENCOUNTER — Ambulatory Visit (INDEPENDENT_AMBULATORY_CARE_PROVIDER_SITE_OTHER): Payer: Medicaid Other | Admitting: Obstetrics and Gynecology

## 2022-07-22 ENCOUNTER — Other Ambulatory Visit (HOSPITAL_COMMUNITY)
Admission: RE | Admit: 2022-07-22 | Discharge: 2022-07-22 | Disposition: A | Discharge status: Home / Self Care | Payer: Medicaid Other | Source: Ambulatory Visit | Attending: Family Medicine | Admitting: Family Medicine

## 2022-07-22 VITALS — BP 121/74 | HR 65 | Ht <= 58 in | Wt 188.0 lb

## 2022-07-22 DIAGNOSIS — Z3009 Encounter for other general counseling and advice on contraception: Secondary | ICD-10-CM

## 2022-07-22 DIAGNOSIS — R87612 Low grade squamous intraepithelial lesion on cytologic smear of cervix (LGSIL): Secondary | ICD-10-CM | POA: Insufficient documentation

## 2022-07-22 DIAGNOSIS — Z01812 Encounter for preprocedural laboratory examination: Secondary | ICD-10-CM

## 2022-07-22 DIAGNOSIS — Z1331 Encounter for screening for depression: Secondary | ICD-10-CM | POA: Diagnosis not present

## 2022-07-22 DIAGNOSIS — F909 Attention-deficit hyperactivity disorder, unspecified type: Secondary | ICD-10-CM

## 2022-07-22 DIAGNOSIS — F332 Major depressive disorder, recurrent severe without psychotic features: Secondary | ICD-10-CM

## 2022-07-22 DIAGNOSIS — Z658 Other specified problems related to psychosocial circumstances: Secondary | ICD-10-CM

## 2022-07-22 DIAGNOSIS — Z30011 Encounter for initial prescription of contraceptive pills: Secondary | ICD-10-CM | POA: Diagnosis not present

## 2022-07-22 LAB — POCT PREGNANCY, URINE: Preg Test, Ur: NEGATIVE

## 2022-07-22 NOTE — Patient Instructions (Addendum)
Center for [facility] at [facility] for Women [place], [place] [address] [phone] (main office) [phone] ([place] office)  SCORE: For the life of your business www.score.[place] www.womenscentergso.org   [facility] [employer].ch [phone]  [facility] (Individual and group grief support) [facility].Radonna Ricker  5874025322  24/7 [place] by [place]:  [facility] (24/7): [phone]   [address] [place], [place] [address] - [place] 24 Hour Crisis Line: [phone]  [facility] (24/7): [phone] [address] [place]. [place], [place] [address] - 24 Hour Crisis Line: [phone]

## 2022-07-22 NOTE — Progress Notes (Signed)
GYNECOLOGY OFFICE VISIT NOTE  History:   Holly [name] is a 27 y.o. G0P0000 here today for treatment of her abnormal pap. She has had persistently abnormal paps of low grade results.   She also screened positive on her PHQ-9. She follows with a therapist but can only see them monthly. She is limited by finances with that therapist. She would like to see someone to finish work up for autism as she did an online screening test that shows she may be on the spectrum. Holly [name] may meet with her today but we also discussed resources across the street. We also discussed option for medication - she declines this for now as she would like to complete evaluation.   She would like OCPs for birth control. She does not want any children. She may never want children. We discussed also permanent forms of birth control should she be 100% sure she never wants children. She is married.   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   Review of Systems:  Pertinent items noted in HPI and remainder of comprehensive ROS otherwise negative.  Physical Exam:  BP 121/74   Pulse 65   Ht '4\' 10"'$  (1.473 m)   Wt 188 lb (85.3 kg)   LMP 07/03/2022 (Exact Date)   BMI 39.29 kg/m  CONSTITUTIONAL: Well-developed, well-nourished female in no acute distress.  HEENT:  Normocephalic, atraumatic. External right and left ear normal. No scleral icterus.  NECK: Normal range of motion, supple, no masses noted on observation SKIN: No rash noted. Not diaphoretic. No erythema. No pallor. MUSCULOSKELETAL: Normal range of motion. No edema noted. NEUROLOGIC: Alert and  oriented to person, place, and time. Normal muscle tone coordination. No cranial nerve deficit noted. PSYCHIATRIC: Normal mood and affect. Normal behavior. Normal judgment and thought content.  Labs and Imaging Results for orders placed or performed in visit on 07/22/22 (from the past 168 hour(s))  Pregnancy, urine POC   Collection Time: 07/22/22  9:12 AM  Result Value Ref Range   Preg Test, Ur NEGATIVE NEGATIVE   No results found.  Assessment and Plan:   1. LGSIL on Pap smear of cervix - We discussed the pros and cons of cryo vs leep. We discussed recovery for both. Reviewed impact on future childbearing with each (I.e. ptb with repeated leep or if pregnancy within 6 months). We discussed pathology result with leep vs not with cryo. Reviewed she is low risk and good candidate for cryo due to low grade. She considered and for her anxiety, would prefer leep procedure to have pathology results.  - See leep op note below.   2. Birth control counseling - OCPs prescribed   3. Positive screening for depression on 9-item Patient Health Questionnaire (PHQ-9) Resources and information given for the [facility] health center. Pt offered appt today with Holly [name] - she will meet with Holly [name]. Marland Kitchen    Routine preventative health maintenance measures emphasized. Please refer to After Visit Summary for other counseling recommendations.   Return in about 4 weeks (around 08/19/2022) for post-op check up - LEEP.  Radene Gunning, MD, [place] for [facility]  Healthcare, [place] [facility]     GYNECOLOGY OFFICE PROCEDURE NOTE  Holly [name] is a 27 y.o. G0P0000 here for LEEP. No GYN concerns. Pap smear and colposcopy history reviewed.  (See above)  Risks, benefits, alternatives, and limitations of procedure explained to patient, including pain, bleeding, infection, failure to remove abnormal tissue and failure to cure dysplasia, need for repeat procedures,  damage to pelvic organs, cervical incompetence.  Role of HPV,cervical dysplasia and need for close followup was empasized. Informed written consent was obtained. All questions were answered. Time out performed. Urine pregnancy test was Negative.  ??Procedure:   The patient was placed in lithotomy position and the bivalved coated speculum was placed in the patient's vagina. A grounding pad placed on the patient. Acetic acid was applied to the cervix and the colposcopy was repeated.   Local anesthesia was administered via an intracervical block using 10 ml of 2% Lidocaine with epinephrine. The suction was turned on and the Small 1X Fisher Cone Biopsy Excisor on 70 Watts of blended current was used to excise the entire transformation zone. Excellent hemostasis was achieved using roller ball coagulation set at 40 Watts coagulation current. Monsel's solution was then applied and the speculum was removed from the vagina. Specimens were sent to pathology.  ?The patient tolerated the procedure well. Post-operative instructions given to patient, including instruction to seek medical attention for persistent bright red bleeding, fever, abdominal/pelvic pain, dysuria, nausea or vomiting. She was also told about the possibility of having copious yellow to black tinged discharge for weeks. She was counseled to avoid anything in the vagina (sex/douching/tampons) for 3 weeks. She has a 4 week post-operative check to assess wound healing, review results and discuss further management.    Radene Gunning, MD, [place] for [facility], [place]

## 2022-07-22 NOTE — BH Specialist Note (Signed)
Integrated Behavioral Health via Telemedicine Visit  07/22/2022 [name] [id number]  Number of [place] Clinician visits: 1- Initial Visit  Session Start time: 1202   Session End time: [id number]  Total time in minutes: 71   Referring Provider: Radene Gunning, MD Patient/Family location: Home [facility] Provider location: Center for [place] at [facility]  All persons participating in visit: Patient Holly [name] and [place]   Types of Service: Individual psychotherapy and Video visit  I connected with [name] and/or [name]'s  n/a  via  Telephone or Video Enabled Telemedicine Application  (Video is Caregility application) and verified that I am speaking with the correct person using two identifiers. Discussed confidentiality: Yes   I discussed the limitations of telemedicine and the availability of in person appointments.  Discussed there is a possibility of technology failure and discussed alternative modes of communication if that failure occurs.  I discussed that engaging in this telemedicine visit, they consent to the provision of behavioral healthcare and the services will be billed under their insurance.  Patient and/or legal guardian expressed understanding and consented to Telemedicine visit: Yes   Presenting Concerns: Patient and/or family reports the following symptoms/concerns: Pt noticing increase in depression, anxiety, crying, anger, insomnia; attributes to life stress (working 3rd shift since November, financial stress with husband out of work, Armed forces technical officer household, recent ADHD diagnosis, suspects possible autism spectrum, mom's death anniversary coming up in April, extra car expenses, etc.); passive SI with no intent and no plan. Pt's goal today is greater control over emotions and stress management; open to implementing self-coping strategies.  Duration of problem: Increasing over time; Severity of problem:  severe  Patient and/or Family's Strengths/Protective Factors: Concrete supports in place (healthy food, safe environments, etc.) and Sense of purpose  Goals Addressed: Patient will:  Reduce symptoms of: anxiety, depression, insomnia, mood instability, and stress   Increase knowledge and/or ability of: self-management skills   Demonstrate ability to: Increase healthy adjustment to current life circumstances  Progress towards Goals: Ongoing  Interventions: Interventions utilized:  Mindfulness or Psychologist, educational, Psychoeducation and/or Health Education, and Link to [employer] Standardized Assessments completed: GAD-7 and PHQ 9  Patient and/or Family Response: Patient agrees with treatment plan.   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   Patient may benefit from psychoeducation and brief therapeutic interventions regarding coping with symptoms of anxiety, depression, insomnia, life stress.  Plan:   Follow up with behavioral health clinician on : Two weeks Behavioral recommendations:  -CALM relaxation breathing exercise twice daily (morning; at bedtime with sleep sounds); as needed throughout the day. -Begin Worry Time strategy, as discussed. Start by setting up start and end time reminders on phone today; continue daily for two weeks. -Consider additional community resources on After Visit Summary, as discussed; use as needed Referral(s): [place] (In Clinic) and [employer]:  legal; business  I discussed the assessment and treatment plan with the patient and/or parent/guardian. They were provided an opportunity to ask questions and all were answered. They agreed with the plan and demonstrated an understanding of the instructions.   They were advised to call back or seek an in-person evaluation if the symptoms worsen or if the  condition fails to improve as anticipated.  Garlan Fair, LCSW     07/22/2022    8:53 AM 12/26/2021    9:50 AM 12/21/2020    8:50 AM 08/28/2020  2:55 PM 04/12/2020    2:58 PM  Depression screen PHQ 2/9  Decreased Interest 3 2 0 1 0  Down, Depressed, Hopeless 2 0 0 0 2  PHQ - 2 Score 5 2 0 1 2  Altered sleeping '3 1 2 2 '$ 0  Tired, decreased energy '3 1 1 1 2  '$ Change in appetite 1 0 1 0 0  Feeling bad or failure about yourself  3 0 0 0 0  Trouble concentrating 3 0 1 0 1  Moving slowly or fidgety/restless '2 3 1 1 '$ 0  Suicidal thoughts 2 1 0 0 0  PHQ-9 Score '22 8 6 5 5  '$ Difficult doing work/chores  Somewhat difficult         07/22/2022    8:53 AM 12/26/2021    9:51 AM 12/21/2020    8:51 AM 08/28/2020    2:56 PM  GAD 7 : Generalized Anxiety Score  Nervous, Anxious, on Edge 3 0 1 1  Control/stop worrying '2 1 1 2  '$ Worry too much - different things '3 1 1 1  '$ Trouble relaxing '3 1 1 '$ 0  Restless 1 1 0 1  Easily annoyed or irritable '3 3 2 '$ 0  Afraid - awful might happen '2 3 1 1  '$ Total GAD 7 Score '17 10 7 6  '$ Anxiety Difficulty  Somewhat difficult

## 2022-07-22 NOTE — Patient Instructions (Signed)
[facility]  [address], [place], [place] [address] [phone] or [phone] WALK-IN URGENT CARE 24/7 FOR ANYONE [address], [place], [place]  [phone] Fax: [phone] [email or web address] *Interpreters available *Accepts all insurance and uninsured for Urgent Care needs *Accepts Medicaid and uninsured for outpatient treatment (below)    ONLY FOR [place] Residents  Below:   Outpatient New Patient Assessment/Therapy Walk-ins:        Monday -Thursday 8am until slots are full.        Every Friday 1pm-4pm  (first come, first served)                   New Patient Psychiatry/Medication Management        Monday-Friday 8am-11am (first come, first served)              For all walk-ins we ask that you arrive by 7:15am, because patients will be seen in the order of arrival.     

## 2022-07-23 LAB — SURGICAL PATHOLOGY

## 2022-07-26 NOTE — BH Specialist Note (Deleted)
Integrated Behavioral Health via Telemedicine Visit  07/26/2022 Holly [name] [id number]  Number of [place] Clinician visits: 1- Initial Visit  Session Start time: 1202   Session End time: [id number]  Total time in minutes: 71   Referring Provider: *** Patient/Family location: [facility] Provider location: *** All persons participating in visit: *** Types of Service: {CHL AMB TYPE OF [phone]}  I connected with Holly [name] and/or Holly [name]'s {family members:20773} via  Telephone or Geologist, engineering  (Video is Caregility application) and verified that I am speaking with the correct person using two identifiers. Discussed confidentiality: {YES/NO:21197}  I discussed the limitations of telemedicine and the availability of in person appointments.  Discussed there is a possibility of technology failure and discussed alternative modes of communication if that failure occurs.  I discussed that engaging in this telemedicine visit, they consent to the provision of behavioral healthcare and the services will be billed under their insurance.  Patient and/or legal guardian expressed understanding and consented to Telemedicine visit: {YES/NO:21197}  Presenting Concerns: Patient and/or family reports the following symptoms/concerns: *** Duration of problem: ***; Severity of problem: {Mild/Moderate/Severe:20260}  Patient and/or Family's Strengths/Protective Factors: {CHL AMB BH PROTECTIVE [phone]}  Goals Addressed: Patient will:  Reduce symptoms of: {IBH Symptoms:21014056}   Increase knowledge and/or ability of: {IBH Patient Tools:21014057}   Demonstrate ability to: {IBH Goals:21014053}  Progress towards Goals: {CHL AMB BH PROGRESS TOWARDS GOALS:[phone]}  Interventions: Interventions utilized:  {IBH Interventions:21014054} Standardized Assessments completed: {IBH Screening Tools:21014051}  Patient and/or Family Response:  ***  Assessment: Patient currently experiencing ***.   Patient may benefit from ***.  Plan: Follow up with behavioral health clinician on : *** Behavioral recommendations: *** Referral(s): {IBH Referrals:21014055}  I discussed the assessment and treatment plan with the patient and/or parent/guardian. They were provided an opportunity to ask questions and all were answered. They agreed with the plan and demonstrated an understanding of the instructions.   They were advised to call back or seek an in-person evaluation if the symptoms worsen or if the condition fails to improve as anticipated.  Caroleen Hamman Kareema Keitt, LCSW

## 2022-08-26 ENCOUNTER — Ambulatory Visit: Payer: Medicaid Other | Admitting: Obstetrics & Gynecology

## 2022-08-26 ENCOUNTER — Telehealth: Payer: Self-pay | Admitting: *Deleted

## 2022-08-26 NOTE — Telephone Encounter (Signed)
Alizandra left a voice message at 1:22pm that she has 1:30 pm appointment today she is not able to make and wants to reschedule Will forward to registrar. Nancy Fetter

## 2022-10-16 ENCOUNTER — Other Ambulatory Visit: Payer: Self-pay

## 2022-10-16 ENCOUNTER — Ambulatory Visit (INDEPENDENT_AMBULATORY_CARE_PROVIDER_SITE_OTHER): Payer: Medicaid Other | Admitting: Obstetrics & Gynecology

## 2022-10-16 ENCOUNTER — Encounter: Payer: Self-pay | Admitting: Obstetrics & Gynecology

## 2022-10-16 VITALS — BP 118/82 | HR 86 | Ht 59.0 in | Wt 178.5 lb

## 2022-10-16 DIAGNOSIS — Z3009 Encounter for other general counseling and advice on contraception: Secondary | ICD-10-CM | POA: Diagnosis not present

## 2022-10-16 NOTE — Progress Notes (Signed)
26 y.o. G0P0000 with undesired fertility desires permanent sterilization. Risks and benefits of laparoscopic tubal sterilization procedure was discussed with the patient including permanence of method, bleeding, infection, injury to surrounding organs, anesthesia and need for additional procedures. Risk failure of 0.5-1% with increased risk of ectopic gestation if pregnancy occurs was also discussed with patient. Female sterilization procedure was discussed as a lower risk option. Patient verbalized understanding and all questions were answered. The risks of surgery were discussed in detail with the patient including but not limited to: bleeding which may require transfusion or reoperation; infection which may require prolonged hospitalization or re-hospitalization and antibiotic therapy; injury to bowel, bladder, ureters and major vessels or other surrounding organs which may lead to other procedures; formation of adhesions; need for additional procedures including laparotomy or subsequent procedures secondary to intraoperative injury or abnormal pathology; thromboembolic phenomenon; incisional problems and other postoperative or anesthesia complications.  Patient was told that the likelihood that her condition and symptoms will be treated effectively with this surgical management was very high; the postoperative expectations were also discussed in detail. The patient also understands the alternative treatment options which were discussed in full. All questions were answered.  She was told that she will be contacted by our surgical scheduler regarding the time and date of her surgery; routine preoperative instructions will be given to her by the preoperative nursing team.  Printed patient education handouts about the procedure were given to the patient to review at home. 20 min face to face and recording  Currie Paris. Debroah Loop MD 10/16/2022

## 2022-10-28 ENCOUNTER — Telehealth: Payer: Self-pay

## 2022-10-28 NOTE — Telephone Encounter (Signed)
Contacted patient to schedule procedure w/ Dr. Debroah Loop. Unable to reach patient, phone rings 3xs and says to try the call at a later time. Contacted patient using the number on file [phone].

## 2022-11-01 ENCOUNTER — Other Ambulatory Visit: Payer: Self-pay | Admitting: Nurse Practitioner

## 2022-11-01 DIAGNOSIS — S82832S Other fracture of upper and lower end of left fibula, sequela: Secondary | ICD-10-CM

## 2022-11-01 DIAGNOSIS — S82302A Unspecified fracture of lower end of left tibia, initial encounter for closed fracture: Secondary | ICD-10-CM

## 2022-11-04 ENCOUNTER — Telehealth: Payer: Self-pay | Admitting: Obstetrics and Gynecology

## 2022-11-04 NOTE — Telephone Encounter (Signed)
Telephone call to patient to see if 11/20/22 would be a good day for her, so I could get her lap tubal scheduled.  Patient states she wants to cancel request and not move forward with the sterilization.

## 2022-11-05 ENCOUNTER — Telehealth: Payer: Self-pay

## 2022-11-05 NOTE — Telephone Encounter (Signed)
Contacted patient to confirm her cancellation. Received her voicemail, left message letting her know I received her request to cancel and the procedure for 11/19/22 has been canceled.

## 2022-11-05 NOTE — Telephone Encounter (Signed)
Patient called back to confirm the cancellation of her 11/19/22 procedure. I confirmed the surgery was canceled. Patient states since the procedure was elective she's decided to hold off to avoid any potential risk w/ surgery.

## 2022-11-19 ENCOUNTER — Ambulatory Visit (HOSPITAL_COMMUNITY)
Admission: RE | Admit: 2022-11-19 | Payer: Medicaid Other | Source: Home / Self Care | Admitting: Obstetrics & Gynecology

## 2022-11-19 ENCOUNTER — Encounter (HOSPITAL_COMMUNITY): Admission: RE | Payer: Self-pay | Source: Home / Self Care

## 2022-11-19 SURGERY — LIGATION, FALLOPIAN TUBE, LAPAROSCOPIC
Anesthesia: Choice | Laterality: Bilateral

## 2022-11-26 ENCOUNTER — Other Ambulatory Visit: Payer: Self-pay

## 2022-11-26 ENCOUNTER — Encounter: Payer: Self-pay | Admitting: Nurse Practitioner

## 2022-11-26 ENCOUNTER — Ambulatory Visit: Payer: Medicaid Other | Attending: Nurse Practitioner | Admitting: Nurse Practitioner

## 2022-11-26 VITALS — BP 99/67 | HR 60 | Ht 59.0 in | Wt 170.2 lb

## 2022-11-26 DIAGNOSIS — R7303 Prediabetes: Secondary | ICD-10-CM | POA: Diagnosis not present

## 2022-11-26 DIAGNOSIS — R7989 Other specified abnormal findings of blood chemistry: Secondary | ICD-10-CM

## 2022-11-26 DIAGNOSIS — L409 Psoriasis, unspecified: Secondary | ICD-10-CM | POA: Diagnosis not present

## 2022-11-26 DIAGNOSIS — Z30011 Encounter for initial prescription of contraceptive pills: Secondary | ICD-10-CM

## 2022-11-26 MED ORDER — BETAMETHASONE VALERATE 0.1 % EX CREA
TOPICAL_CREAM | Freq: Two times a day (BID) | CUTANEOUS | 6 refills | Status: AC
  Filled 2022-11-26: qty 90, 30d supply, fill #0

## 2022-11-26 MED ORDER — NORGESTIM-ETH ESTRAD TRIPHASIC 0.18/0.215/0.25 MG-25 MCG PO TABS
1.0000 | ORAL_TABLET | Freq: Every day | ORAL | 11 refills | Status: AC
  Filled 2022-11-26: qty 28, 28d supply, fill #0
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
## 2022-11-26 NOTE — Patient Instructions (Signed)
[facility] [address] [place], [place] [address] [phone]

## 2022-11-27 ENCOUNTER — Other Ambulatory Visit: Payer: Self-pay

## 2022-11-27 LAB — CMP14+EGFR
ALT: 10 IU/L (ref 0–32)
AST: 15 IU/L (ref 0–40)
Albumin: 4.2 g/dL (ref 4.0–5.0)
Alkaline Phosphatase: 57 IU/L (ref 44–121)
BUN/Creatinine Ratio: 11 (ref 9–23)
BUN: 8 mg/dL (ref 6–20)
Bilirubin Total: 0.4 mg/dL (ref 0.0–1.2)
CO2: 24 mmol/L (ref 20–29)
Calcium: 9.5 mg/dL (ref 8.7–10.2)
Chloride: 101 mmol/L (ref 96–106)
Creatinine, Ser: 0.75 mg/dL (ref 0.57–1.00)
Globulin, Total: 2.7 g/dL (ref 1.5–4.5)
Glucose: 84 mg/dL (ref 70–99)
Potassium: 4.2 mmol/L (ref 3.5–5.2)
Sodium: 138 mmol/L (ref 134–144)
Total Protein: 6.9 g/dL (ref 6.0–8.5)
eGFR: 110 mL/min/{1.73_m2} (ref >59)

## 2022-11-27 LAB — CBC WITH DIFFERENTIAL/PLATELET
Basophils Absolute: 0 10*3/uL (ref 0.0–0.2)
Basos: 1 %
EOS (ABSOLUTE): 0.2 10*3/uL (ref 0.0–0.4)
Eos: 2 %
Hematocrit: 40.8 % (ref 34.0–46.6)
Hemoglobin: 12.9 g/dL (ref 11.1–15.9)
Immature Grans (Abs): 0 10*3/uL (ref 0.0–0.1)
Immature Granulocytes: 0 %
Lymphocytes Absolute: 3.2 10*3/uL — ABNORMAL HIGH (ref 0.7–3.1)
Lymphs: 46 %
MCH: 28.2 pg (ref 26.6–33.0)
MCHC: 31.6 g/dL (ref 31.5–35.7)
MCV: 89 fL (ref 79–97)
Monocytes Absolute: 0.5 10*3/uL (ref 0.1–0.9)
Monocytes: 7 %
Neutrophils Absolute: 3.1 10*3/uL (ref 1.4–7.0)
Neutrophils: 44 %
Platelets: 172 10*3/uL (ref 150–450)
RBC: 4.57 x10E6/uL (ref 3.77–5.28)
RDW: 13 % (ref 11.7–15.4)
WBC: 7 10*3/uL (ref 3.4–10.8)

## 2022-11-27 LAB — HEMOGLOBIN A1C
Est. average glucose Bld gHb Est-mCnc: 108 mg/dL
Hgb A1c MFr Bld: 5.4 % (ref 4.8–5.6)

## 2022-11-29 ENCOUNTER — Other Ambulatory Visit: Payer: Self-pay

## 2022-12-25 ENCOUNTER — Ambulatory Visit: Payer: Self-pay

## 2022-12-25 NOTE — Telephone Encounter (Signed)
  Chief Complaint: covid   Symptoms: cough, post nasal drainage, wheezing with deep breath, productive cough , Frequency: Friday  Pertinent Negatives: Patient denies SOB,  Disposition: [] ED /[] Urgent Care (no appt availability in office) / [] Appointment(In office/virtual)/ []  [facility]/ [x] Home Care/ [] Refused Recommended Disposition /[]  Mobile Bus/ []  Follow-up with PCP Additional Notes: home care Reason for Disposition . [1] COVID-19 diagnosed by positive lab test (e.g., PCR, rapid self-test kit) AND [2] mild symptoms (e.g., cough, fever, others) AND [3] no complications or SOB  Answer Assessment - Initial Assessment Questions 1. RESPIRATORY STATUS: "Describe your breathing?" (e.g., wheezing, shortness of breath, unable to speak, severe coughing)      SOB with wheezing  2. ONSET: "When did this breathing problem begin?"      yes 3. PATTERN "Does the difficult breathing come and go, or has it been constant since it started?"      Down  4. SEVERITY: "How bad is your breathing?" (e.g., mild, moderate, severe)    - MILD: No SOB at rest, mild SOB with walking, speaks normally in sentences, can lie down, no retractions, pulse < 100.    - MODERATE: SOB at rest, SOB with minimal exertion and prefers to sit, cannot lie down flat, speaks in phrases, mild retractions, audible wheezing, pulse 100-120.    - SEVERE: Very SOB at rest, speaks in single words, struggling to breathe, sitting hunched forward, retractions, pulse > 120      no 8. CAUSE: "What do you think is causing the breathing problem?"      Covid Day 0 Friday  9. OTHER SYMPTOMS: "Do you have any other symptoms? (e.g., dizziness, runny nose, cough, chest pain, fever)     Cough, sore throat, nasal congestion. Lower back  Answer Assessment - Initial Assessment Questions 1. COVID-19 DIAGNOSIS: "How do you know that you have COVID?" (e.g., positive lab test or self-test, diagnosed by doctor or NP/PA, symptoms after  exposure).     Self tes  3. ONSET: "When did the COVID-19 symptoms start?"      Last Friday 5. COUGH: "Do you have a cough?" If Yes, ask: "How bad is the cough?"       yes 6. FEVER: "Do you have a fever?" If Yes, ask: "What is your temperature, how was it measured, and when did it start?"     no 7. RESPIRATORY STATUS: "Describe your breathing?" (e.g., normal; shortness of breath, wheezing, unable to speak)      Wheezes with deep breath 9. OTHER SYMPTOMS: "Do you have any other symptoms?"  (e.g., chills, fatigue, headache, loss of smell or taste, muscle pain, sore throat)     Sore throat, post nasal drip cough  Protocols used: Breathing Difficulty-A-AH, Coronavirus (COVID-19) Diagnosed or Suspected-A-AH

## 2022-12-26 ENCOUNTER — Encounter: Payer: Self-pay | Admitting: Nurse Practitioner

## 2022-12-26 ENCOUNTER — Telehealth: Payer: Self-pay

## 2022-12-26 NOTE — Telephone Encounter (Signed)
Copied from CRM [id number]. Topic: General - Other >> Dec 25, 2022  1:49 PM Lennox Pippins wrote: Patient called and stated she took an at home covid test and is trying to file a leave of absence from work, that will be faxed from her work today 12/25/2022. Transferred patient over to [facility] per symptoms but wanted to give PCP a heads up this form will be faxed over.

## 2022-12-30 ENCOUNTER — Encounter: Payer: Self-pay | Admitting: Nurse Practitioner

## 2023-04-18 ENCOUNTER — Other Ambulatory Visit: Payer: Medicaid Other | Admitting: Hematology and Oncology

## 2023-04-18 ENCOUNTER — Other Ambulatory Visit (HOSPITAL_COMMUNITY)
Admission: RE | Admit: 2023-04-18 | Discharge: 2023-04-18 | Disposition: A | Discharge status: Home / Self Care | Payer: Medicaid Other | Source: Ambulatory Visit | Attending: Obstetrics and Gynecology | Admitting: Obstetrics and Gynecology

## 2023-04-18 DIAGNOSIS — Z124 Encounter for screening for malignant neoplasm of cervix: Secondary | ICD-10-CM
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
## 2023-04-23 LAB — CYTOLOGY - PAP
Adequacy: ABSENT
Comment: NEGATIVE
Diagnosis: NEGATIVE
High risk HPV: NEGATIVE

## 2023-04-24 ENCOUNTER — Telehealth: Payer: Self-pay

## 2023-04-24 ENCOUNTER — Other Ambulatory Visit: Payer: Self-pay

## 2023-04-24 ENCOUNTER — Other Ambulatory Visit: Payer: Self-pay | Admitting: Hematology and Oncology

## 2023-04-24 MED ORDER — FLUCONAZOLE 150 MG PO TABS
150.0000 mg | ORAL_TABLET | Freq: Every day | ORAL | 0 refills | Status: AC
  Filled 2023-04-24: qty 2, 2d supply, fill #0

## 2023-04-24 NOTE — Telephone Encounter (Addendum)
Per Ilda Basset, FNP-BC, Rx diflucan sent to [facility]. Patient informed.    Patient informed negative pap/HPV-results, repeat pap in 1 year. Patient asked if results showed any yeast infection. Patient informed it did not. Patient stated that she has switched her soap to [place] as suggested, has not seen much change, continues to have strange vaginal odor, gel-like discharge, and vaginal itching. Sent message to Ilda Basset, FNP-BC.

## 2023-05-05 ENCOUNTER — Other Ambulatory Visit: Payer: Self-pay

## 2023-05-14 DIAGNOSIS — Z419 Encounter for procedure for purposes other than remedying health state, unspecified: Secondary | ICD-10-CM | POA: Diagnosis not present

## 2023-05-30 ENCOUNTER — Ambulatory Visit: Payer: Medicaid Other | Admitting: Nurse Practitioner

## 2023-06-14 DIAGNOSIS — Z419 Encounter for procedure for purposes other than remedying health state, unspecified: Secondary | ICD-10-CM | POA: Diagnosis not present

## 2023-07-12 DIAGNOSIS — Z419 Encounter for procedure for purposes other than remedying health state, unspecified: Secondary | ICD-10-CM | POA: Diagnosis not present

## 2023-08-23 DIAGNOSIS — Z419 Encounter for procedure for purposes other than remedying health state, unspecified: Secondary | ICD-10-CM | POA: Diagnosis not present

## 2023-09-22 DIAGNOSIS — Z419 Encounter for procedure for purposes other than remedying health state, unspecified: Secondary | ICD-10-CM | POA: Diagnosis not present

## 2023-10-23 DIAGNOSIS — Z419 Encounter for procedure for purposes other than remedying health state, unspecified: Secondary | ICD-10-CM | POA: Diagnosis not present

## 2023-11-22 DIAGNOSIS — Z419 Encounter for procedure for purposes other than remedying health state, unspecified: Secondary | ICD-10-CM | POA: Diagnosis not present

## 2023-12-23 DIAGNOSIS — Z419 Encounter for procedure for purposes other than remedying health state, unspecified: Secondary | ICD-10-CM | POA: Diagnosis not present

## 2024-01-23 DIAGNOSIS — Z419 Encounter for procedure for purposes other than remedying health state, unspecified: Secondary | ICD-10-CM | POA: Diagnosis not present
